# Patient Record
Sex: Male | Born: 1935 | Race: White | Hispanic: No | Marital: Married | Smoking: Former smoker
Health system: Southern US, Community
[De-identification: ages and names within clinical notes are randomized; demographics above are authoritative.]

## PROBLEM LIST (undated history)

## (undated) DIAGNOSIS — N289 Disorder of kidney and ureter, unspecified: Secondary | ICD-10-CM

## (undated) DIAGNOSIS — N451 Epididymitis: Secondary | ICD-10-CM

## (undated) HISTORY — PX: HEMORROIDECTOMY: SUR656

## (undated) HISTORY — PX: VASECTOMY: SHX75

## (undated) HISTORY — PX: CHOLECYSTECTOMY: SHX55

## (undated) HISTORY — PX: ELBOW SURGERY: SHX618

---

## 2006-06-26 ENCOUNTER — Encounter: Admission: RE | Admit: 2006-06-26 | Discharge: 2006-06-26 | Payer: Self-pay | Admitting: Nephrology

## 2008-06-23 ENCOUNTER — Encounter: Admission: RE | Admit: 2008-06-23 | Discharge: 2008-06-23 | Payer: Self-pay | Admitting: Orthopedic Surgery

## 2008-06-28 ENCOUNTER — Ambulatory Visit (HOSPITAL_BASED_OUTPATIENT_CLINIC_OR_DEPARTMENT_OTHER): Admission: RE | Admit: 2008-06-28 | Discharge: 2008-06-28 | Payer: Self-pay | Admitting: Orthopedic Surgery

## 2010-07-04 LAB — BASIC METABOLIC PANEL
CO2: 25 mEq/L (ref 19–32)
Calcium: 9.3 mg/dL (ref 8.4–10.5)
GFR calc Af Amer: 55 mL/min — ABNORMAL LOW (ref >60)
GFR calc non Af Amer: 45 mL/min — ABNORMAL LOW (ref >60)
Sodium: 140 mEq/L (ref 135–145)

## 2010-08-07 NOTE — Op Note (Signed)
NAME:  Ethan Andrade, Ethan Andrade                ACCOUNT NO.:  [id number]   MEDICAL RECORD NO.:  [id number]          PATIENT TYPE:  AMB   LOCATION:  DSC                          FACILITY:  [facility]   PHYSICIAN:  Katy Fitch. Sypher, M.D. 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   POSTOPERATIVE DIAGNOSIS:  Chronic entrapment neuropathy, left ulnar  nerve at cubital tunnel with positive electrodiagnostic studies,  intrinsic atrophy, positive Froment sign, and positive Wartenberg sign.   OPERATION:  In situ decompression of left ulnar nerve with partial  resection of medial head of triceps.   OPERATING SURGEON:  Katy Fitch. Sypher, MD   ASSISTANT:  Marveen Reeks Dasnoit, PAC   ANESTHESIA:  General by LMA.   SUPERVISING ANESTHESIOLOGIST:  Maren Beach, MD   INDICATIONS:  Ethan Andrade is a 75 year old gentleman referred through  the courtesy of Dr. Catha Gosselin for evaluation and management of left  hand weakness following an elbow injury 4 months prior.  Ethan Andrade  reported striking his elbow and rapidly developing numbness in the ring  and small fingers of the left hand accompanied by weakness of grip and  pinch.  Clinical examination revealed signs of a significant ulnar  neuropathy with nerve irritation symptoms at the cubital tunnel and  positive percussion test, positive hyperflexion test, weak pinch grip,  intrinsic atrophy, positive Froment sign, and positive Wartenberg sign.   Electrodiagnostic studies completed by Dr. Sharmon Revere, revealed  evidence of marked swelling across the left cubital tunnel with a  conduction loss of 34 meters per second.   Due to a failed respond to nonoperative  measures, Ethan Andrade was brought  to the operating room at this time for exploration of his ulnar nerve at  cubital tunnel anticipating decompression and possible transposition.  Preoperatively, he was advised that it would take up to 16 months to see  the full benefit of decompression if he had an axonal lesion.  He also  understands that if we find nerve instability, anterior subcutaneous  transposition would be indicated.   Questions were invited and answered in detail with Ethan Andrade and his  wife preoperatively.   PROCEDURE:  Ethan Andrade was brought to the operating room and placed in  supine position on operating table.   Following the induction of general anesthesia by LMA technique under Dr.  Michaelle Copas direct supervision, the left arm was prepped with Betadine soap  and solution, sterilely draped.  Ancef 1 g was administered as an IV  prophylactic antibiotic preoperatively.   The arm was exsanguinated with Esmarch bandage and an arterial  tourniquet on the proximal brachium inflated to 250 mmHg.  Procedure  commenced with a 2-cm incision directly over the path of the ulnar nerve  posterior to the medial epicondyle.  Subcutaneous tissues were carefully  divided along the subcutaneous fascia.  This  was quite thickened due to  post-traumatic fibrosis.  This was carefully divided taking care to  identify and retract the posterior branch of the medial antebrachial  cutaneous nerve.   The ulnar nerve was decompressed from 6 cm above the epicondyle to 6 cm  distal the epicondyle.   Osborne's band was definitely contracted causing compression of the  nerve and fascia, deep to the heads of flexor carpi ulnaris was also  contracted causing compression.  After thorough unroofing of the nerve,  hemostasis was achieved with bipolar cautery.  The elbow was ranged 0-  140 degrees and found to have no sign of ulnar nerve instability.   The medial head of the triceps did cover the nerve with  elbow flexion  beyond 95 degrees.  A strip of medial head measuring 4 mm in width and  approximately 2 cm in length was resected to unroof the nerve.  Thereafter, there was no irritation of the nerve from the triceps.   The wound was then repaired with subcutaneous suture of 4-0 Vicryl and  intradermal 3-0 Prolene with Steri-Strips.  Lidocaine 2% was infiltrated  for postop analgesia.  There were no apparent complications.      Katy Fitch Sypher, M.D.  Electronically Signed     RVS/MEDQ  D:  06/28/2008  T:  06/28/2008  Job:  [id number]   cc:   Caryn Bee L. Little, M.D.

## 2011-11-14 ENCOUNTER — Inpatient Hospital Stay (HOSPITAL_COMMUNITY)
Admission: EM | Admit: 2011-11-14 | Discharge: 2011-11-17 | DRG: 690 | Disposition: A | Discharge status: Home / Self Care | Payer: Medicare Other | Attending: Internal Medicine | Admitting: Internal Medicine

## 2011-11-14 ENCOUNTER — Inpatient Hospital Stay (HOSPITAL_COMMUNITY): Payer: Medicare Other

## 2011-11-14 ENCOUNTER — Encounter (HOSPITAL_COMMUNITY): Payer: Self-pay | Admitting: Emergency Medicine

## 2011-11-14 DIAGNOSIS — Z79899 Other long term (current) drug therapy: Secondary | ICD-10-CM

## 2011-11-14 DIAGNOSIS — D72829 Elevated white blood cell count, unspecified: Secondary | ICD-10-CM | POA: Diagnosis present

## 2011-11-14 DIAGNOSIS — Z7982 Long term (current) use of aspirin: Secondary | ICD-10-CM

## 2011-11-14 DIAGNOSIS — N179 Acute kidney failure, unspecified: Secondary | ICD-10-CM | POA: Diagnosis present

## 2011-11-14 DIAGNOSIS — R339 Retention of urine, unspecified: Secondary | ICD-10-CM | POA: Diagnosis present

## 2011-11-14 DIAGNOSIS — E860 Dehydration: Secondary | ICD-10-CM | POA: Diagnosis present

## 2011-11-14 DIAGNOSIS — A498 Other bacterial infections of unspecified site: Secondary | ICD-10-CM | POA: Diagnosis present

## 2011-11-14 DIAGNOSIS — N390 Urinary tract infection, site not specified: Principal | ICD-10-CM | POA: Diagnosis present

## 2011-11-14 DIAGNOSIS — N289 Disorder of kidney and ureter, unspecified: Secondary | ICD-10-CM | POA: Diagnosis present

## 2011-11-14 HISTORY — DX: Disorder of kidney and ureter, unspecified: N28.9

## 2011-11-14 HISTORY — DX: Epididymitis: N45.1

## 2011-11-14 LAB — BASIC METABOLIC PANEL
CO2: 21 mEq/L (ref 19–32)
Calcium: 8.7 mg/dL (ref 8.4–10.5)
Creatinine, Ser: 1.54 mg/dL — ABNORMAL HIGH (ref 0.50–1.35)
GFR calc Af Amer: 50 mL/min — ABNORMAL LOW (ref >90)

## 2011-11-14 LAB — URINALYSIS, MICROSCOPIC ONLY
Protein, ur: NEGATIVE mg/dL
Urobilinogen, UA: 1 mg/dL (ref 0.0–1.0)

## 2011-11-14 LAB — CBC WITH DIFFERENTIAL/PLATELET
Basophils Absolute: 0 10*3/uL (ref 0.0–0.1)
Lymphs Abs: 2.2 10*3/uL (ref 0.7–4.0)
MCH: 33.5 pg (ref 26.0–34.0)
MCV: 94.7 fL (ref 78.0–100.0)
Monocytes Absolute: 1.9 10*3/uL — ABNORMAL HIGH (ref 0.1–1.0)
Platelets: 233 10*3/uL (ref 150–400)
RDW: 12.7 % (ref 11.5–15.5)

## 2011-11-14 LAB — PROCALCITONIN: Procalcitonin: 1.12 ng/mL

## 2011-11-14 LAB — LACTIC ACID, PLASMA: Lactic Acid, Venous: 1.7 mmol/L (ref 0.5–2.2)

## 2011-11-14 MED ORDER — ONDANSETRON HCL 4 MG/2ML IJ SOLN: 4.0000 mg | Freq: Three times a day (TID) | INTRAMUSCULAR | Status: DC | PRN

## 2011-11-14 MED ORDER — DEXTROSE 5 % IV SOLN
1.0000 g | INTRAVENOUS | Status: DC
  Administered 2011-11-15 – 2011-11-17 (×3): 1 g via INTRAVENOUS
  Filled 2011-11-14 (×3): qty 10

## 2011-11-14 MED ORDER — ACETAMINOPHEN 325 MG PO TABS
650.0000 mg | ORAL_TABLET | Freq: Four times a day (QID) | ORAL | Status: DC | PRN
  Administered 2011-11-14: 650 mg via ORAL
  Filled 2011-11-14: qty 2

## 2011-11-14 MED ORDER — ACETAMINOPHEN 325 MG PO TABS: 650.0000 mg | ORAL_TABLET | ORAL | Status: DC | PRN

## 2011-11-14 MED ORDER — ZOLPIDEM TARTRATE 5 MG PO TABS: 5.0000 mg | ORAL_TABLET | Freq: Every evening | ORAL | Status: DC | PRN

## 2011-11-14 MED ORDER — SODIUM CHLORIDE 0.9 % IV SOLN
Freq: Once | INTRAVENOUS | Status: AC
  Administered 2011-11-14: 13:00:00 via INTRAVENOUS

## 2011-11-14 MED ORDER — DEXTROSE 5 % IV SOLN
1.0000 g | Freq: Once | INTRAVENOUS | Status: AC
  Administered 2011-11-14: 1 g via INTRAVENOUS
  Filled 2011-11-14: qty 10

## 2011-11-14 MED ORDER — ONDANSETRON HCL 4 MG PO TABS: 4.0000 mg | ORAL_TABLET | Freq: Four times a day (QID) | ORAL | Status: DC | PRN

## 2011-11-14 MED ORDER — ONDANSETRON HCL 4 MG/2ML IJ SOLN: 4.0000 mg | Freq: Four times a day (QID) | INTRAMUSCULAR | Status: DC | PRN

## 2011-11-14 MED ORDER — HYDROCODONE-ACETAMINOPHEN 5-325 MG PO TABS
1.0000 | ORAL_TABLET | ORAL | Status: DC | PRN
  Administered 2011-11-15: 1 via ORAL
  Filled 2011-11-14: qty 1

## 2011-11-14 MED ORDER — SODIUM CHLORIDE 0.9 % IV SOLN
INTRAVENOUS | Status: DC
  Administered 2011-11-14 – 2011-11-16 (×5): via INTRAVENOUS

## 2011-11-14 MED ORDER — ACETAMINOPHEN 325 MG PO TABS
650.0000 mg | ORAL_TABLET | Freq: Four times a day (QID) | ORAL | Status: DC | PRN
  Administered 2011-11-15: 650 mg via ORAL
  Filled 2011-11-14 (×2): qty 2

## 2011-11-14 MED ORDER — ACETAMINOPHEN 650 MG RE SUPP: 650.0000 mg | Freq: Four times a day (QID) | RECTAL | Status: DC | PRN

## 2011-11-14 MED ORDER — SODIUM CHLORIDE 0.9 % IV SOLN
INTRAVENOUS | Status: AC
  Administered 2011-11-14: 125 mL/h via INTRAVENOUS

## 2011-11-14 NOTE — ED Notes (Signed)
14 french foley catheter was placed, however, it's currently leaking.  Materials notified regarding larger foley size (16 french).

## 2011-11-14 NOTE — Progress Notes (Signed)
Ethan Andrade, is a 76 y.o. male,   MRN: [id number]  -  DOB - 03-29-1935  Outpatient Primary MD for the patient is Mickie Hillier, MD  in for    Chief Complaint  Patient presents with  . Urinary Tract Infection  . Urinary Retention     Blood pressure 123/61, pulse 91, temperature 99.8 F (37.7 C), temperature source Oral, resp. rate 18, SpO2 99.00%.  Principal Problem:  *Urinary retention Active Problems:  UTI (lower urinary tract infection)  Leukocytosis  Renal insufficiency   Very pleasant 76 yo hx renal insuff and epididymitis presents with cc urinary retention, dysuria, fever, chills. Pt reports symptoms began several days ago and have gotten progressively worse especially the dysuria and urinary retention. He also reports some abdominal pain. States he decreased his po intake because it was too painful to void. Then this am his bladder was full and he was unable to void. He called his pcp who recommended coming to ED.   Work up yields WC 31.2, temp 99.8. Urinalysis yields large leukocytes, WBC too numerous to count. Urine culture pending.   Other vital signs stable. Lactic acid and procalcitonin within the limits of normal. Blood cultures pending. Given IV fluids and rocephin and tylenol in ed.   On exam, pt is alert, warm to touch but in no acute distress. Non-toxic appearing. Not meeting criteria for SIRs/SEPSIS at this time.   Will admit to med surg.

## 2011-11-14 NOTE — H&P (Signed)
[facility] History and Physical  Ethan Andrade BJY:[id number] DOB: 09/24/1935 DOA: 11/14/2011  Referring physician: Angelique Holm PA PCP: Mickie Hillier, MD   Chief Complaint: urinary retention, dysuria  HPI:   Very pleasant 76 yo hx renal insuff and epididymitis presents with cc urinary retention, dysuria, fever, chills. Pt reports symptoms began several days ago and have gotten progressively worse especially the dysuria and urinary retention. He also reports some abdominal pain. States he decreased his po intake because it was too painful to void. Then this am his bladder was full and he was unable to void. He called his pcp who recommended coming to ED. Symptoms came on gradually have persisted and worsened. Nothing makes better and nothing makes worse. He denies CP, palpitation, shortness of breath,  headache, dizziness, numbness of extremities,  nausea/vomiting, diarrhea, constipation, melena. Work up in ED yields WC 31.2, temp 99.8. Urinalysis yields large leukocytes, WBC too numerous to count. Urine culture pending. Other vital signs stable. Lactic acid and procalcitonin within the limits of normal. Blood cultures pending. Given IV fluids and rocephin and tylenol in ed. [facility] has been asked to admit for further eval/treatment   Review of Systems:  10 point system reviewed and negative except as indicated in HPI  Past Medical History  Diagnosis Date  . Epididymitis   . Renal disorder     creatinine levels are high   Past Surgical History  Procedure Date  . Cholecystectomy    Social History:  reports that he has never smoked. He has never used smokeless tobacco. He reports that he does not drink alcohol or use illicit drugs. Lives at home with wife. Is independent with ADL  No Known Allergies  Family History  Problem Relation Age of Onset  . Other Brother     prostate cancer  . Other Brother     prostate cancer     Mother died at 13 of cirrhosis due to tylenol  use. Father deceased at 37 had prostate cancer. 6 siblings no CAD, DM.  Prior to Admission medications   Medication Sig Start Date End Date Taking? Authorizing Provider  aspirin 81 MG chewable tablet Chew 81 mg by mouth daily.   Yes Historical Provider, MD  Cholecalciferol (VITAMIN D-3) 5000 UNITS TABS Take 5,000 Units by mouth daily.   Yes Historical Provider, MD  fish oil-omega-3 fatty acids 1000 MG capsule Take 2 g by mouth 2 (two) times daily.   Yes Historical Provider, MD  Multiple Vitamins-Minerals (CENTRUM SILVER PO) Take 1 tablet by mouth daily.   Yes Historical Provider, MD   Physical Exam: Filed Vitals:   11/14/11 1022 11/14/11 1149  BP: 126/63 123/61  Pulse: 96 91  Temp: 98.9 F (37.2 C) 99.8 F (37.7 C)  TempSrc: Oral   Resp: 16 18  SpO2: 98% 99%     General:  Awake alert NAD  Eyes: PERRL EOMI no scleral icterus   ENT: mouth dry/pink. No drainage from nose. Ears clear  Neck: supple. Full rom no JVD  Cardiovascular: RRR, no MGR No LEE  Respiratory: normal effort. BSCTAB no wheeze/rhonchi  Abdomen: obese, soft + BS mild tenderness to palpation in bladder area.   Skin: warm/dry no rash/lesions  Musculoskeletal: MOE no joint swelling/erythema  Psychiatric: appropriate affect, cooperative  Neurologic: cranial nerve II-XII intact. Speech clear facial symmetry  Labs on Admission:  Basic Metabolic Panel:  Lab 11/14/11 0865  NA 135  K 4.2  CL 103  CO2 21  GLUCOSE 95  BUN 20  CREATININE 1.54*  CALCIUM 8.7  MG --  PHOS --   Liver Function Tests: No results found for this basename: AST:5,ALT:5,ALKPHOS:5,BILITOT:5,PROT:5,ALBUMIN:5 in the last 168 hours No results found for this basename: LIPASE:5,AMYLASE:5 in the last 168 hours No results found for this basename: AMMONIA:5 in the last 168 hours CBC:  Lab 11/14/11 1050  WBC 31.2*  NEUTROABS 27.1*  HGB 15.1  HCT 42.7  MCV 94.7  PLT 233   Cardiac Enzymes: No results found for this basename:  CKTOTAL:5,CKMB:5,CKMBINDEX:5,TROPONINI:5 in the last 168 hours  BNP (last 3 results) No results found for this basename: PROBNP:3 in the last 8760 hours CBG: No results found for this basename: GLUCAP:5 in the last 168 hours  Radiological Exams on Admission: No results found.  EKG: Independently reviewed.   Assessment/Plan Principal Problem:  *Urinary retention : likely related to UTI. Will continue foley catheter for now. Strict Intake and output. Will get renal US eval for obstruction. Plan to dc foley in 24-36 hours for voiding trial Active Problems:  UTI (lower urinary tract infection) cultures pending. Continue Rocephin until culture result. Provide pain med as needed.    Leukocytosis: likely related to UTI. Will continue rocephin. Blood culture and urine culture pending. Will get renal US. Provide tylenol for fever. Pt currently hemodynamically stable. Lactic acid and procalcitonin within normal limits. Will get vitals q2hrs x3. Will recheck cbc in am.    Renal insufficiency: creatinine currently 1.5. Chart review indicates that this is baseline. Will continue IV fluids, hold nephrotoxins and monitor.    Code Status: full Family Communication: wife and pt at bedside Disposition Plan: Home when medically stable.   Time spent: 40 minutes  Gwenyth Bender NP [facility]  If 7PM-7AM, please contact night-coverage [email or web address] Password [facility] 11/14/2011, 2:04 PM  Addendum: Patient was seen and examined at bedside. I have reviewed labs on admission. Plan: 1. UTI, leukocytosis - continue rocephin IV Q 24 hours 2. Urinary retention - will follow up on renal US, foley in place  Appreciate great help from NP in preparing this admission note.  For questions and concerns, call Manson Passey [facility]  Pager [phone]

## 2011-11-14 NOTE — ED Notes (Signed)
Had burning on urination started yesterday, and stopped urinating last night, has hx epididymitis years ago.

## 2011-11-14 NOTE — ED Notes (Signed)
Put in a Foley Catheter 14 inch leaking ,replaced it with a 16 inch

## 2011-11-14 NOTE — Progress Notes (Signed)
Confirmed with wife that pcp is kevin little

## 2011-11-14 NOTE — ED Provider Notes (Signed)
History     CSN: [id number]  Arrival date & time 11/14/11  1016   First MD Initiated Contact with Patient 11/14/11 1033      Chief Complaint  Patient presents with  . Urinary Tract Infection  . Urinary Retention    (Consider location/radiation/quality/duration/timing/severity/associated sxs/prior treatment) Patient is a 76 y.o. male presenting with urinary tract infection. The history is provided by the patient.  Urinary Tract Infection This is a new problem. The current episode started in the past 7 days. The problem occurs constantly. The problem has been gradually worsening. Associated symptoms include abdominal pain, chills, a fever and myalgias. Pertinent negatives include no change in bowel habit, headaches, nausea, neck pain, vomiting or weakness. Exacerbated by: urination. He has tried nothing for the symptoms.  Pt reports pain with urination for the last several days. States gradually, pain has worsened, and he began going to the bathroom frequently, going very little at a time. States this morning, unable to go at all.  Feels like bladder is full, but pt states he is unable to urinate.   Past Medical History  Diagnosis Date  . Epididymitis   . Renal disorder     creatinine levels are high    Past Surgical History  Procedure Date  . Cholecystectomy     History reviewed. No pertinent family history.  History  Substance Use Topics  . Smoking status: Never Smoker   . Smokeless tobacco: Not on file  . Alcohol Use: No      Review of Systems  Constitutional: Positive for fever and chills.  HENT: Negative for neck pain.   Respiratory: Negative.   Cardiovascular: Negative.   Gastrointestinal: Positive for abdominal pain. Negative for nausea, vomiting, diarrhea, constipation and change in bowel habit.  Genitourinary: Positive for dysuria, urgency, frequency, decreased urine volume and difficulty urinating. Negative for hematuria, flank pain, discharge, penile  swelling, scrotal swelling, penile pain and testicular pain.  Musculoskeletal: Positive for myalgias. Negative for back pain.  Skin: Negative.   Neurological: Negative for dizziness, weakness and headaches.  Psychiatric/Behavioral: Negative.     Allergies  Review of patient's allergies indicates no known allergies.  Home Medications  No current outpatient prescriptions on file.  BP 126/63  Pulse 96  Temp 98.9 F (37.2 C) (Oral)  Resp 16  SpO2 98%  Physical Exam  Nursing note and vitals reviewed. Constitutional: He is oriented to person, place, and time. He appears well-developed and well-nourished. No distress.  HENT:  Head: Normocephalic.  Eyes: Conjunctivae are normal.  Neck: Normal range of motion. Neck supple.  Cardiovascular: Normal rate, regular rhythm and normal heart sounds.   Pulmonary/Chest: Effort normal and breath sounds normal. No respiratory distress. He has no wheezes. He has no rales.  Abdominal: Soft. Bowel sounds are normal. There is no rebound.       Mild discomfort with palpation over bladder. No obvious distention  Musculoskeletal: He exhibits no edema.  Neurological: He is alert and oriented to person, place, and time.  Skin: Skin is warm and dry.  Psychiatric: He has a normal mood and affect.    ED Course  Procedures (including critical care time)  Pt with urinary symptoms and retention. VS here normal. Pt afebrile. Will get labs, UA, cultures.   Results for orders placed during the hospital encounter of 11/14/11  URINALYSIS, WITH MICROSCOPIC      Component Value Range   Color, Urine AMBER (*) YELLOW   APPearance CLOUDY (*) CLEAR   Specific  Gravity, Urine 1.024  1.005 - 1.030   pH 6.0  5.0 - 8.0   Glucose, UA NEGATIVE  NEGATIVE mg/dL   Hgb urine dipstick MODERATE (*) NEGATIVE   Bilirubin Urine NEGATIVE  NEGATIVE   Ketones, ur TRACE (*) NEGATIVE mg/dL   Protein, ur NEGATIVE  NEGATIVE mg/dL   Urobilinogen, UA 1.0  0.0 - 1.0 mg/dL   Nitrite  NEGATIVE  NEGATIVE   Leukocytes, UA LARGE (*) NEGATIVE   WBC, UA TOO NUMEROUS TO COUNT  <3 WBC/hpf   RBC / HPF 7-10  <3 RBC/hpf   Squamous Epithelial / LPF RARE  RARE  CBC WITH DIFFERENTIAL      Component Value Range   WBC 31.2 (*) 4.0 - 10.5 K/uL   RBC 4.51  4.22 - 5.81 MIL/uL   Hemoglobin 15.1  13.0 - 17.0 g/dL   HCT 16.1  09.6 - 04.5 %   MCV 94.7  78.0 - 100.0 fL   MCH 33.5  26.0 - 34.0 pg   MCHC 35.4  30.0 - 36.0 g/dL   RDW 40.9  81.1 - 91.4 %   Platelets 233  150 - 400 K/uL   Neutrophils Relative 87 (*) 43 - 77 %   Lymphocytes Relative 7 (*) 12 - 46 %   Monocytes Relative 6  3 - 12 %   Eosinophils Relative 0  0 - 5 %   Basophils Relative 0  0 - 1 %   Neutro Abs 27.1 (*) 1.7 - 7.7 K/uL   Lymphs Abs 2.2  0.7 - 4.0 K/uL   Monocytes Absolute 1.9 (*) 0.1 - 1.0 K/uL   Eosinophils Absolute 0.0  0.0 - 0.7 K/uL   Basophils Absolute 0.0  0.0 - 0.1 K/uL   WBC Morphology WHITE COUNT CONFIRMED ON SMEAR    BASIC METABOLIC PANEL      Component Value Range   Sodium 135  135 - 145 mEq/L   Potassium 4.2  3.5 - 5.1 mEq/L   Chloride 103  96 - 112 mEq/L   CO2 21  19 - 32 mEq/L   Glucose, Bld 95  70 - 99 mg/dL   BUN 20  6 - 23 mg/dL   Creatinine, Ser 7.82 (*) 0.50 - 1.35 mg/dL   Calcium 8.7  8.4 - 95.6 mg/dL   GFR calc non Af Amer 43 (*) >90 mL/min   GFR calc Af Amer 50 (*) >90 mL/min   No results found.  WBC 31, will get lactic acid, cultures. Pt still does not meed sepsis criteria, vs normal. No AMS. Rocephin ordered for infection. Urine cultures sent.  Filed Vitals:   11/14/11 1149  BP: 123/61  Pulse: 91  Temp: 99.8 F (37.7 C)  Resp: 18     1. Urinary tract infection   2. Urinary retention   3. Leukocytosis   4. Renal insufficiency       MDM          Lottie Mussel, [place] 11/16/11 [phone]

## 2011-11-15 DIAGNOSIS — N179 Acute kidney failure, unspecified: Secondary | ICD-10-CM | POA: Diagnosis present

## 2011-11-15 LAB — CBC WITH DIFFERENTIAL/PLATELET
Basophils Absolute: 0 10*3/uL (ref 0.0–0.1)
Eosinophils Relative: 0 % (ref 0–5)
HCT: 38.4 % — ABNORMAL LOW (ref 39.0–52.0)
Lymphocytes Relative: 8 % — ABNORMAL LOW (ref 12–46)
Lymphs Abs: 2.4 10*3/uL (ref 0.7–4.0)
MCV: 95 fL (ref 78.0–100.0)
Monocytes Relative: 5 % (ref 3–12)
Neutro Abs: 25.8 10*3/uL — ABNORMAL HIGH (ref 1.7–7.7)
RBC: 4.04 MIL/uL — ABNORMAL LOW (ref 4.22–5.81)
WBC Morphology: INCREASED
WBC: 29.7 10*3/uL — ABNORMAL HIGH (ref 4.0–10.5)

## 2011-11-15 LAB — COMPREHENSIVE METABOLIC PANEL
Albumin: 2.9 g/dL — ABNORMAL LOW (ref 3.5–5.2)
BUN: 18 mg/dL (ref 6–23)
Calcium: 8 mg/dL — ABNORMAL LOW (ref 8.4–10.5)
Creatinine, Ser: 1.66 mg/dL — ABNORMAL HIGH (ref 0.50–1.35)
GFR calc Af Amer: 45 mL/min — ABNORMAL LOW (ref >90)
Glucose, Bld: 82 mg/dL (ref 70–99)
Total Protein: 5.7 g/dL — ABNORMAL LOW (ref 6.0–8.3)

## 2011-11-15 LAB — GLUCOSE, CAPILLARY: Glucose-Capillary: 74 mg/dL (ref 70–99)

## 2011-11-15 MED ORDER — SODIUM CHLORIDE 0.9 % IV BOLUS (SEPSIS)
250.0000 mL | Freq: Once | INTRAVENOUS | Status: AC
  Administered 2011-11-15: 250 mL via INTRAVENOUS

## 2011-11-15 MED ORDER — POLYETHYLENE GLYCOL 3350 17 G PO PACK
17.0000 g | PACK | Freq: Every day | ORAL | Status: DC
  Administered 2011-11-15: 17 g via ORAL
  Filled 2011-11-15 (×3): qty 1

## 2011-11-15 NOTE — Progress Notes (Signed)
[facility] PROGRESS NOTE  Ethan Andrade ZOX:[id number] DOB: 1935/08/03 DOA: 11/14/2011 PCP: Mickie Hillier, MD  Brief narrative: 76 year old pleasant gentleman who presented 11/14/2011 with complaints of urinary retention, fever and dysuria.  Assessment/Plan:  Principal Problem:  *Urinary retention - Perhaps secondary to urinary tract infection and dehydration - Renal ultrasound with normal finding - Foley in place, less than 24 hours, we will discontinue it tomorrow for a voiding trial  Active Problems:  UTI (lower urinary tract infection) - Continue Rocephin - Blood cultures negative to date   Leukocytosis - Secondary to UTI - Management as above   Acute kidney injury - Secondary to UTI - Creatinine 1.66 today - Followup BMP in the morning  Code Status: Full code Family Communication: Updated family at bedside Disposition Plan: Home when stable  Manson Passey, MD  [facility] Pager [phone]  If 7PM-7AM, please contact night-coverage [email or web address] Password [email or web address] 11/15/2011, 3:05 PM   LOS: 1 day   Consultants:  None  Procedures:  None  Antibiotics:  Rocephin 11/14/2011 -->  HPI/Subjective: No acute events overnight.  Objective: Filed Vitals:   11/14/11 1551 11/14/11 2133 11/15/11 0610 11/15/11 0640  BP: 100/62 114/73 90/58 90/52   Pulse: 92 101 83   Temp: 99 F (37.2 C) 99.1 F (37.3 C) 99.1 F (37.3 C)   TempSrc: Oral Oral Oral   Resp: 18 22 18    Height: 5\' 10"  (1.778 m)     Weight: 99.791 kg (220 lb)  99.791 kg (220 lb)   SpO2: 96% 95% 95%     Intake/Output Summary (Last 24 hours) at 11/15/11 1505 Last data filed at 11/15/11 1344  Gross per 24 hour  Intake   2030 ml  Output   1300 ml  Net    730 ml    Exam:   General:  Pt is alert, follows commands appropriately, not in acute distress  Cardiovascular: Regular rate and rhythm, S1/S2, no murmurs, no rubs, no gallops  Respiratory: Clear to auscultation  bilaterally, no wheezing, no crackles, no rhonchi  Abdomen: Soft, non tender, non distended, bowel sounds present, no guarding  Extremities: No edema, pulses DP and PT palpable bilaterally  Neuro: Grossly nonfocal  Data Reviewed: Basic Metabolic Panel:  Lab 11/15/11 8295 11/14/11 1050  NA 132* 135  K 4.2 4.2  CL 102 103  CO2 19 21  GLUCOSE 82 95  BUN 18 20  CREATININE 1.66* 1.54*  CALCIUM 8.0* 8.7   Liver Function Tests:  Lab 11/15/11 0333  AST 21  ALT 16  ALKPHOS 68  BILITOT 0.6  PROT 5.7*  ALBUMIN 2.9*   CBC:  Lab 11/15/11 0333 11/14/11 1050  WBC 29.7* 31.2*  HGB 13.2 15.1  HCT 38.4* 42.7  MCV 95.0 94.7  PLT 196 233   CBG:  Lab 11/15/11 0809  GLUCAP 74    CULTURE, BLOOD (ROUTINE X 2)     Status: Normal (Preliminary result)   Collection Time   11/14/11 12:11 PM      Component Value Range Status Comment   Culture     Final    Value:        BLOOD CULTURE RECEIVED NO GROWTH TO DATE    Report Status PENDING   Incomplete        Status: Normal (Preliminary result)   Collection Time   11/14/11 12:40 PM      Component Value Range Status Comment   Culture     Final  Value:        BLOOD CULTURE RECEIVED NO GROWTH TO DATE    Report Status PENDING   Incomplete      Studies: US Renal 11/14/2011   IMPRESSION: Negative for renal obstruction.      Scheduled Meds:   . cefTRIAXone (ROCEPHIN)  IV  1 g Intravenous Q24H

## 2011-11-15 NOTE — Progress Notes (Signed)
UR done. 

## 2011-11-15 NOTE — Progress Notes (Signed)
PT Cancellation Note  Treatment cancelled today due to pt sound asleep.  Family in room states that pt is 'doing fine' with his ambulation and does not need PT.  Ebony Hail [facility] 11/15/2011, 11:07 AM

## 2011-11-16 DIAGNOSIS — N179 Acute kidney failure, unspecified: Secondary | ICD-10-CM

## 2011-11-16 LAB — CBC
MCH: 32.9 pg (ref 26.0–34.0)
MCHC: 34.4 g/dL (ref 30.0–36.0)
Platelets: 175 10*3/uL (ref 150–400)
RBC: 3.86 MIL/uL — ABNORMAL LOW (ref 4.22–5.81)
RDW: 12.9 % (ref 11.5–15.5)

## 2011-11-16 LAB — BASIC METABOLIC PANEL
Calcium: 8.3 mg/dL — ABNORMAL LOW (ref 8.4–10.5)
GFR calc Af Amer: 52 mL/min — ABNORMAL LOW (ref >90)
GFR calc non Af Amer: 44 mL/min — ABNORMAL LOW (ref >90)
Sodium: 134 mEq/L — ABNORMAL LOW (ref 135–145)

## 2011-11-16 LAB — GLUCOSE, CAPILLARY: Glucose-Capillary: 123 mg/dL — ABNORMAL HIGH (ref 70–99)

## 2011-11-16 MED ORDER — ACYCLOVIR 5 % EX OINT
1.0000 "application " | TOPICAL_OINTMENT | Freq: Every day | CUTANEOUS | Status: DC
  Administered 2011-11-16 – 2011-11-17 (×5): 1 via TOPICAL
  Filled 2011-11-16: qty 30

## 2011-11-16 NOTE — Progress Notes (Addendum)
[facility] PROGRESS NOTE  Ethan Andrade ZOX:[id number] DOB: 18-Jan-1936 DOA: 11/14/2011 PCP: Mickie Hillier, MD  Brief narrative: 76 year old pleasant gentleman who presented 11/14/2011 with complaints of urinary retention, fever and dysuria.   Assessment/Plan:   Principal Problem:  *Urinary retention  - Perhaps secondary to urinary tract infection and dehydration  - Renal ultrasound with normal finding  - will d/c foley for voiding trial  Active Problems:  UTI (lower urinary tract infection) secondary to E.Coli - Continue Rocephin  - urine culture grew E. Coli. Follow up sensitivities - Blood cultures negative to date   Leukocytosis  - Secondary to UTI  - Management as above   Acute kidney injury  - Secondary to UTI  - Creatinine trending down  - Followup BMP in the morning   Code Status: Full code  Family Communication: Updated family at bedside  Disposition Plan: Home when stable likely in next 24 hours  Consultants:  None Procedures:  None Antibiotics:  Rocephin 11/14/2011 -->  Manson Passey, MD  [facility] Pager [phone]  If 7PM-7AM, please contact night-coverage [email or web address] Password [email or web address] 11/16/2011, 9:25 AM   LOS: 2 days   HPI/Subjective: No acute events overnight.  Objective: Filed Vitals:   11/15/11 0640 11/15/11 1315 11/15/11 2214 11/16/11 0642  BP: 90/52 113/63 149/72 130/64  Pulse:  82 84 86  Temp:  98.9 F (37.2 C) 98.9 F (37.2 C) 100.6 F (38.1 C)  TempSrc:  Oral Oral Oral  Resp:  20 18 18   Height:      Weight:    100.381 kg (221 lb 4.8 oz)  SpO2:  96% 98% 96%    Intake/Output Summary (Last 24 hours) at 11/16/11 0925 Last data filed at 11/16/11 0644  Gross per 24 hour  Intake 2411.32 ml  Output   3600 ml  Net -1188.68 ml    Exam:   General:  Pt is alert, follows commands appropriately, not in acute distress  Cardiovascular: Regular rate and rhythm, S1/S2, no murmurs, no rubs, no  gallops  Respiratory: Clear to auscultation bilaterally, no wheezing, no crackles, no rhonchi  Abdomen: Soft, non tender, non distended, bowel sounds present, no guarding  Extremities: No edema, pulses DP and PT palpable bilaterally  Neuro: Grossly nonfocal  Data Reviewed: Basic Metabolic Panel:  Lab 11/16/11 8295 11/15/11 0333 11/14/11 1050  NA 134* 132* 135  K 3.9 4.2 4.2  CL 104 102 103  CO2 18* 19 21  GLUCOSE 101* 82 95  BUN 18 18 20   CREATININE 1.49* 1.66* 1.54*  CALCIUM 8.3* 8.0* 8.7  MG -- -- --  PHOS -- -- --   Liver Function Tests:  Lab 11/15/11 0333  AST 21  ALT 16  ALKPHOS 68  BILITOT 0.6  PROT 5.7*  ALBUMIN 2.9*   No results found for this basename: LIPASE:5,AMYLASE:5 in the last 168 hours No results found for this basename: AMMONIA:5 in the last 168 hours CBC:  Lab 11/16/11 0420 11/15/11 0333 11/14/11 1050  WBC 18.4* 29.7* 31.2*  NEUTROABS -- 25.8* 27.1*  HGB 12.7* 13.2 15.1  HCT 36.9* 38.4* 42.7  MCV 95.6 95.0 94.7  PLT 175 196 233   Cardiac Enzymes: No results found for this basename: CKTOTAL:5,CKMB:5,CKMBINDEX:5,TROPONINI:5 in the last 168 hours BNP: No components found with this basename: POCBNP:5 CBG:  Lab 11/16/11 0746 11/15/11 0809  GLUCAP 123* 74    Recent Results (from the past 240 hour(s))  URINE CULTURE     Status: Normal (Preliminary  result)   Collection Time   11/14/11 11:14 AM      Component Value Range Status Comment   Specimen Description URINE, CATHETERIZED   Final    Special Requests NONE   Final    Culture  Setup Time 11/15/2011 02:31   Final    Colony Count >=100,000 COLONIES/ML   Final    Culture ESCHERICHIA COLI   Final    Report Status PENDING   Incomplete   CULTURE, BLOOD (ROUTINE X 2)     Status: Normal (Preliminary result)   Collection Time   11/14/11 12:11 PM      Component Value Range Status Comment   Specimen Description BLOOD LEFT ARM   Final    Special Requests BOTTLES DRAWN AEROBIC AND ANAEROBIC 3CC  EACH   Final    Culture  Setup Time 11/14/2011 20:40   Final    Culture     Final    Value:        BLOOD CULTURE RECEIVED NO GROWTH TO DATE CULTURE WILL BE HELD FOR 5 DAYS BEFORE ISSUING A FINAL NEGATIVE REPORT   Report Status PENDING   Incomplete   CULTURE, BLOOD (ROUTINE X 2)     Status: Normal (Preliminary result)   Collection Time   11/14/11 12:40 PM      Component Value Range Status Comment   Specimen Description BLOOD LEFT HAND   Final    Special Requests BOTTLES DRAWN AEROBIC AND ANAEROBIC 4CC EACH   Final    Culture  Setup Time 11/14/2011 20:39   Final    Culture     Final    Value:        BLOOD CULTURE RECEIVED NO GROWTH TO DATE CULTURE WILL BE HELD FOR 5 DAYS BEFORE ISSUING A FINAL NEGATIVE REPORT   Report Status PENDING   Incomplete      Studies: US Renal  11/14/2011  *RADIOLOGY REPORT*  Clinical Data: UTI.  Fever  RENAL/URINARY TRACT ULTRASOUND COMPLETE  Comparison:  Ultrasound 06/26/2006  Findings:  Right Kidney:  10.9 cm.  Negative  Left Kidney:  10.7 cm.  Negative  Bladder:  Empty urinary bladder with a catheter.  IMPRESSION: Negative for renal obstruction.   Original Report Authenticated By: Camelia Phenes, M.D.     Scheduled Meds:   . cefTRIAXone (ROCEPHIN)  IV  1 g Intravenous Q24H  . polyethylene glycol  17 g Oral Daily   Continuous Infusions:   . sodium chloride 75 mL/hr at 11/16/11 [phone]

## 2011-11-17 LAB — CBC
HCT: 36.8 % — ABNORMAL LOW (ref 39.0–52.0)
Hemoglobin: 12.9 g/dL — ABNORMAL LOW (ref 13.0–17.0)
MCH: 32.9 pg (ref 26.0–34.0)
MCHC: 35.1 g/dL (ref 30.0–36.0)
RBC: 3.92 MIL/uL — ABNORMAL LOW (ref 4.22–5.81)

## 2011-11-17 MED ORDER — ACYCLOVIR 5 % EX OINT: 1.0000 "application " | TOPICAL_OINTMENT | Freq: Every day | CUTANEOUS | Status: AC

## 2011-11-17 MED ORDER — CIPROFLOXACIN HCL 500 MG PO TABS: 500.0000 mg | ORAL_TABLET | Freq: Two times a day (BID) | ORAL | Status: AC

## 2011-11-17 NOTE — Discharge Summary (Signed)
Physician Discharge Summary  Ethan Andrade EAV:[id number] DOB: 12-26-1935 DOA: 11/14/2011  PCP: Mickie Hillier, MD  Admit date: 11/14/2011 Discharge date: 11/17/2011  Outpatient follow up Patient reported having a physical exam appointment with PCP coming beginning of the September and will follow up on checking renal function to ensure the trend down  Discharge Diagnoses:  Principal Problem:  *Urinary retention Active Problems:  UTI (lower urinary tract infection)  Leukocytosis  Acute kidney injury  Discharge Condition: medically stable for discharge home today  Diet recommendation: as tolerated   History of present illness:  76 year old pleasant gentleman who presented 11/14/2011 with complaints of urinary retention, fever and dysuria.   Assessment/Plan:   Principal Problem:  *Urinary retention  - Perhaps secondary to urinary tract infection and dehydration  - Renal ultrasound with normal finding  - patient able to void freely  Active Problems:  UTI (lower urinary tract infection) secondary to E.Coli  - will d/c home with cipro for 7 days - urine culture grew E. Coli.  - Blood cultures negative to date   Leukocytosis  - Secondary to UTI  - Management as above  - WBC count today 11.4  Acute kidney injury  - Secondary to UTI  - Creatinine trending down   Code Status: Full code  Family Communication: Updated family at bedside  Disposition Plan: Home today  Consultants:  None Procedures:  None Antibiotics:  Rocephin 11/14/2011 -->11/17/2011; start cipro on discharge for 7 days  Discharge Exam: Filed Vitals:   11/17/11 0510  BP: 136/75  Pulse: 77  Temp: 98.2 F (36.8 C)  Resp: 16   Filed Vitals:   11/16/11 0642 11/16/11 1332 11/16/11 2101 11/17/11 0510  BP: 130/64 140/69 136/61 136/75  Pulse: 86 78 79 77  Temp: 100.6 F (38.1 C) 98.2 F (36.8 C) 98 F (36.7 C) 98.2 F (36.8 C)  TempSrc: Oral Oral Oral Oral  Resp: 18 18 19 16   Height:        Weight: 100.381 kg (221 lb 4.8 oz)     SpO2: 96% 100% 98% 96%    General: Pt is alert, follows commands appropriately, not in acute distress Cardiovascular: Regular rate and rhythm, S1/S2 +, no murmurs, no rubs, no gallops Respiratory: Clear to auscultation bilaterally, no wheezing, no crackles, no rhonchi Abdominal: Soft, non tender, non distended, bowel sounds +, no guarding Extremities: no edema, no cyanosis, pulses palpable bilaterally DP and PT Neuro: Grossly nonfocal  Discharge Instructions  Discharge Orders    Future Orders Please Complete By Expires   Diet - low sodium heart healthy      Increase activity slowly      Call MD for:  persistant nausea and vomiting      Call MD for:  severe uncontrolled pain      Call MD for:  difficulty breathing, headache or visual disturbances      Call MD for:  persistant dizziness or light-headedness        Medication List  As of 11/17/2011  9:09 AM   TAKE these medications         acyclovir ointment 5 %   Commonly known as: ZOVIRAX   Apply 1 application topically 5 (five) times daily.      aspirin 81 MG chewable tablet   Chew 81 mg by mouth daily.      CENTRUM SILVER PO   Take 1 tablet by mouth daily.      ciprofloxacin 500 MG tablet  Commonly known as: CIPRO   Take 1 tablet (500 mg total) by mouth 2 (two) times daily.      fish oil-omega-3 fatty acids 1000 MG capsule   Take 2 g by mouth 2 (two) times daily.      Vitamin D-3 5000 UNITS Tabs   Take 5,000 Units by mouth daily.           Follow-up Information    Follow up with Mickie Hillier, MD. (or sooner if symptoms worsen)    Contact information:   [address] [place] [address] [phone]           The results of significant diagnostics from this hospitalization (including imaging, microbiology, ancillary and laboratory) are listed below for reference.    Significant Diagnostic Studies: US Renal 11/14/2011  *RADIOLOGY REPORT*   Clinical Data: UTI.  Fever  RENAL/URINARY TRACT ULTRASOUND COMPLETE  Comparison:  Ultrasound 06/26/2006  Findings:  Right Kidney:  10.9 cm.  Negative  Left Kidney:  10.7 cm.  Negative  Bladder:  Empty urinary bladder with a catheter.  IMPRESSION: Negative for renal obstruction.   Original Report Authenticated By: Camelia Phenes, M.D.     Microbiology: URINE CULTURE     Status: Normal (Preliminary result)   Collection Time   11/14/11 11:14 AM      Component Value Range Status Comment   Specimen Description URINE, CATH  Final    Special Requests NONE   Final    Culture  Setup Time 11/15/2011 02:31   Final    Colony Count >=100,000 COLONIES/ML  Final    Culture ESCHERICHIA COLI   Final    Report Status PENDING   Incomplete   CULTURE, BLOOD (ROUTINE X 2)     Status: Normal (Preliminary result)   Collection Time   11/14/11 12:11 PM      Component Value Range Status Comment   Culture     Final    Value:        BLOOD CULTURE RECEIVED NO GROWTH TO DATE    Report Status PENDING   Incomplete   CULTURE, BLOOD (ROUTINE X 2)     Status: Normal (Preliminary result)   Collection Time   11/14/11 12:40 PM      Component Value Range Status Comment   Culture     Final    Value:        BLOOD CULTURE RECEIVED NO GROWTH TO DATE    Report Status PENDING   Incomplete      Labs: Basic Metabolic Panel:  Lab 11/16/11 9147 11/15/11 0333 11/14/11 1050  NA 134* 132* 135  K 3.9 4.2 4.2  CL 104 102 103  CO2 18* 19 21  GLUCOSE 101* 82 95  BUN 18 18 20   CREATININE 1.49* 1.66* 1.54*  CALCIUM 8.3* 8.0* 8.7   Liver Function Tests:  Lab 11/15/11 0333  AST 21  ALT 16  ALKPHOS 68  BILITOT 0.6  PROT 5.7*  ALBUMIN 2.9*   CBC:  Lab 11/16/11 0420 11/15/11 0333 11/14/11 1050  WBC 18.4* 29.7* 31.2*  HGB 12.7* 13.2 15.1  HCT 36.9* 38.4* 42.7  MCV 95.6 95.0 94.7  PLT 175 196 233   CBG:  Lab 11/17/11 0752 11/16/11 0746 11/15/11 0809  GLUCAP 150* 123* 74    Time coordinating discharge: Over 30  minutes  Signed:  Manson Passey, MD  [facility] 11/17/2011, 9:09 AM  Pager #: [phone]

## 2011-11-17 NOTE — Progress Notes (Signed)
Pt discharged to home with family, discharge instructions reviewed with pt and wife, new RX's given.

## 2011-11-19 NOTE — ED Provider Notes (Signed)
Medical screening examination/treatment/procedure(s) were conducted as a shared visit with non-physician practitioner(s) and myself.  I personally evaluated the patient during the encounter  Cyndra Numbers, MD 11/19/11 [phone]

## 2011-11-20 LAB — CULTURE, BLOOD (ROUTINE X 2): Culture: NO GROWTH

## 2011-11-20 LAB — URINE CULTURE: Colony Count: >=100000

## 2014-04-22 DIAGNOSIS — R799 Abnormal finding of blood chemistry, unspecified: Secondary | ICD-10-CM | POA: Diagnosis not present

## 2014-04-22 DIAGNOSIS — G4700 Insomnia, unspecified: Secondary | ICD-10-CM | POA: Diagnosis not present

## 2014-05-06 DIAGNOSIS — L989 Disorder of the skin and subcutaneous tissue, unspecified: Secondary | ICD-10-CM | POA: Diagnosis not present

## 2014-05-17 DIAGNOSIS — D0462 Carcinoma in situ of skin of left upper limb, including shoulder: Secondary | ICD-10-CM | POA: Diagnosis not present

## 2014-05-17 DIAGNOSIS — D225 Melanocytic nevi of trunk: Secondary | ICD-10-CM | POA: Diagnosis not present

## 2014-05-19 DIAGNOSIS — H3531 Nonexudative age-related macular degeneration: Secondary | ICD-10-CM | POA: Diagnosis not present

## 2014-07-19 DIAGNOSIS — D0462 Carcinoma in situ of skin of left upper limb, including shoulder: Secondary | ICD-10-CM | POA: Diagnosis not present

## 2014-07-19 DIAGNOSIS — X32XXXD Exposure to sunlight, subsequent encounter: Secondary | ICD-10-CM | POA: Diagnosis not present

## 2014-07-19 DIAGNOSIS — L570 Actinic keratosis: Secondary | ICD-10-CM | POA: Diagnosis not present

## 2014-09-19 DIAGNOSIS — I10 Essential (primary) hypertension: Secondary | ICD-10-CM | POA: Diagnosis not present

## 2014-09-19 DIAGNOSIS — G4721 Circadian rhythm sleep disorder, delayed sleep phase type: Secondary | ICD-10-CM | POA: Diagnosis not present

## 2014-09-22 DIAGNOSIS — G4721 Circadian rhythm sleep disorder, delayed sleep phase type: Secondary | ICD-10-CM | POA: Diagnosis not present

## 2014-11-08 DIAGNOSIS — G4721 Circadian rhythm sleep disorder, delayed sleep phase type: Secondary | ICD-10-CM | POA: Diagnosis not present

## 2014-11-08 DIAGNOSIS — F321 Major depressive disorder, single episode, moderate: Secondary | ICD-10-CM | POA: Diagnosis not present

## 2014-11-11 DIAGNOSIS — Z23 Encounter for immunization: Secondary | ICD-10-CM | POA: Diagnosis not present

## 2014-11-11 DIAGNOSIS — R7989 Other specified abnormal findings of blood chemistry: Secondary | ICD-10-CM | POA: Diagnosis not present

## 2014-11-11 DIAGNOSIS — F329 Major depressive disorder, single episode, unspecified: Secondary | ICD-10-CM | POA: Diagnosis not present

## 2014-11-11 DIAGNOSIS — R946 Abnormal results of thyroid function studies: Secondary | ICD-10-CM | POA: Diagnosis not present

## 2014-11-17 DIAGNOSIS — H3531 Nonexudative age-related macular degeneration: Secondary | ICD-10-CM | POA: Diagnosis not present

## 2014-12-22 DIAGNOSIS — G4721 Circadian rhythm sleep disorder, delayed sleep phase type: Secondary | ICD-10-CM | POA: Diagnosis not present

## 2015-01-11 DIAGNOSIS — F17200 Nicotine dependence, unspecified, uncomplicated: Secondary | ICD-10-CM | POA: Diagnosis not present

## 2015-01-11 DIAGNOSIS — E785 Hyperlipidemia, unspecified: Secondary | ICD-10-CM | POA: Diagnosis not present

## 2015-01-11 DIAGNOSIS — L570 Actinic keratosis: Secondary | ICD-10-CM | POA: Diagnosis not present

## 2015-01-11 DIAGNOSIS — G4700 Insomnia, unspecified: Secondary | ICD-10-CM | POA: Diagnosis not present

## 2015-01-11 DIAGNOSIS — Z0000 Encounter for general adult medical examination without abnormal findings: Secondary | ICD-10-CM | POA: Diagnosis not present

## 2015-01-11 DIAGNOSIS — N183 Chronic kidney disease, stage 3 (moderate): Secondary | ICD-10-CM | POA: Diagnosis not present

## 2015-01-11 DIAGNOSIS — J309 Allergic rhinitis, unspecified: Secondary | ICD-10-CM | POA: Diagnosis not present

## 2015-01-11 DIAGNOSIS — I10 Essential (primary) hypertension: Secondary | ICD-10-CM | POA: Diagnosis not present

## 2015-01-18 DIAGNOSIS — X32XXXD Exposure to sunlight, subsequent encounter: Secondary | ICD-10-CM | POA: Diagnosis not present

## 2015-01-18 DIAGNOSIS — L570 Actinic keratosis: Secondary | ICD-10-CM | POA: Diagnosis not present

## 2015-05-30 DIAGNOSIS — M25562 Pain in left knee: Secondary | ICD-10-CM | POA: Diagnosis not present

## 2015-06-01 DIAGNOSIS — S83242A Other tear of medial meniscus, current injury, left knee, initial encounter: Secondary | ICD-10-CM | POA: Diagnosis not present

## 2015-06-07 DIAGNOSIS — M25562 Pain in left knee: Secondary | ICD-10-CM | POA: Diagnosis not present

## 2015-06-08 DIAGNOSIS — M25562 Pain in left knee: Secondary | ICD-10-CM | POA: Diagnosis not present

## 2016-02-02 DIAGNOSIS — F338 Other recurrent depressive disorders: Secondary | ICD-10-CM | POA: Diagnosis not present

## 2016-02-02 DIAGNOSIS — Z0000 Encounter for general adult medical examination without abnormal findings: Secondary | ICD-10-CM | POA: Diagnosis not present

## 2016-02-02 DIAGNOSIS — I10 Essential (primary) hypertension: Secondary | ICD-10-CM | POA: Diagnosis not present

## 2016-02-02 DIAGNOSIS — Z23 Encounter for immunization: Secondary | ICD-10-CM | POA: Diagnosis not present

## 2016-02-02 DIAGNOSIS — N183 Chronic kidney disease, stage 3 (moderate): Secondary | ICD-10-CM | POA: Diagnosis not present

## 2016-02-02 DIAGNOSIS — G4700 Insomnia, unspecified: Secondary | ICD-10-CM | POA: Diagnosis not present

## 2016-02-02 DIAGNOSIS — F17200 Nicotine dependence, unspecified, uncomplicated: Secondary | ICD-10-CM | POA: Diagnosis not present

## 2016-02-02 DIAGNOSIS — L570 Actinic keratosis: Secondary | ICD-10-CM | POA: Diagnosis not present

## 2016-02-02 DIAGNOSIS — E782 Mixed hyperlipidemia: Secondary | ICD-10-CM | POA: Diagnosis not present

## 2016-02-02 DIAGNOSIS — Z125 Encounter for screening for malignant neoplasm of prostate: Secondary | ICD-10-CM | POA: Diagnosis not present

## 2016-02-13 DIAGNOSIS — L570 Actinic keratosis: Secondary | ICD-10-CM | POA: Diagnosis not present

## 2016-02-13 DIAGNOSIS — X32XXXD Exposure to sunlight, subsequent encounter: Secondary | ICD-10-CM | POA: Diagnosis not present

## 2016-02-28 DIAGNOSIS — H1851 Endothelial corneal dystrophy: Secondary | ICD-10-CM | POA: Diagnosis not present

## 2016-02-28 DIAGNOSIS — H43813 Vitreous degeneration, bilateral: Secondary | ICD-10-CM | POA: Diagnosis not present

## 2016-02-28 DIAGNOSIS — H2513 Age-related nuclear cataract, bilateral: Secondary | ICD-10-CM | POA: Diagnosis not present

## 2016-02-28 DIAGNOSIS — H31092 Other chorioretinal scars, left eye: Secondary | ICD-10-CM | POA: Diagnosis not present

## 2016-02-28 DIAGNOSIS — H35373 Puckering of macula, bilateral: Secondary | ICD-10-CM | POA: Diagnosis not present

## 2016-05-29 DIAGNOSIS — R251 Tremor, unspecified: Secondary | ICD-10-CM | POA: Diagnosis not present

## 2016-05-29 DIAGNOSIS — G4700 Insomnia, unspecified: Secondary | ICD-10-CM | POA: Diagnosis not present

## 2016-06-27 ENCOUNTER — Encounter: Payer: Self-pay | Admitting: Neurology

## 2016-06-27 ENCOUNTER — Ambulatory Visit (INDEPENDENT_AMBULATORY_CARE_PROVIDER_SITE_OTHER): Payer: Medicare HMO | Admitting: Neurology

## 2016-06-27 VITALS — BP 110/62 | HR 92 | Resp 18 | Ht 69.5 in | Wt 236.0 lb

## 2016-06-27 DIAGNOSIS — G4721 Circadian rhythm sleep disorder, delayed sleep phase type: Secondary | ICD-10-CM

## 2016-06-27 MED ORDER — ZOLPIDEM TARTRATE 5 MG PO TABS: 5.0000 mg | ORAL_TABLET | Freq: Every evening | ORAL | 0 refills | Status: DC | PRN

## 2016-06-27 NOTE — Progress Notes (Signed)
SLEEP MEDICINE CLINIC   Provider:  Larey Seat, [place] D  Primary Care Physician:  Ethan Pac, MD   Referring Provider: Hulan Fess, MD    Chief Complaint  Patient presents with  . Sleep Consult    Rm 10. Patient states that he has been seeing a sleep doctor and was diagnosed with CRSD. Patient is also c/o tremors in his hands, and whole body jerking during the night.     HPI:  Ethan Andrade is a 81 y.o. male , seen here as in a referral to our sleep clinic,  from Dr. Hulan Andrade , referral for Insomnia (and Tremor).  Chief complaint according to patient : Trembling, and cannot go to sleep. Feeling fatigued.   Ethan Andrade carries a diagnosis of circadian rhythm sleep disorder, The manifestation of a circadian rhythm disorder is usually but the patient cannot sleep at night and sleeps best in the morning hours or daytime. This can be a form of insomnia. 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 This is an acquired form of a circadian rhythm disorder. He has followed Dr. Maxwell Andrade for the last 2-3 years. Dr. Maxwell Andrade had initiated a regimen including Lunesta as a sleep aid and SSRI antidepressants. Dr. Maxwell Andrade had implemented a certain sleep routine advancing the bedtime gradually over a period of time. Before the patient saw Dr. Maxwell Andrade in his bedtime could be as late as 4 AM. About 3 years ago they begun changing his bedtime with the help of the above medications, but the patient states that Ethan Andrade so and stopped working for him. In addition, he became ill and couldn't leave the bed for about 3 days, which resets his inner clock. He also struggles with each time change. Dr Ethan Andrade does not want to prescribe sleep aids. His wife has noted jerking movements and trembling in his sleep. The amplitude is strong enough to wake her up.     My Colleague , Dr Ethan Andrade , is a MD, PhD Movement disorder specialist in the [facility]. I may need her input .    Sleep habits are as follows: Just recently the patient has been able to sleep only every other day but not at night. He goes at least 24 hours without sleep sometimes 36. He is known to sometimes not sleep t2-3 days in a row. This affects his social life of the family and marriage. Currently he goes to bed around 2 AM trying to go to sleep- but not always successfully. Sometimes he has by choice decided not to sleep in daytime and usually can then sleep some hours at night. He does not take daytime naps. If he goes to sleep at 2 AM he will stay asleep until his wife wakes him up at 12:00 or 13.00 hours. No snoring, No apnea.    Sleep medical history and family sleep history:  No family history of sleep disorders . Oldest sister takes a sleep aid.  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   Social history: married, former Veterinary surgeon, physically active shift job, retired at Andrade 48. Used to sleep a lot in hotels, [employer], worker's cabins.  He has a very remote history of smoking and quit at Andrade 38, is been a nonsmoker for 50+ years. He quit drinking the same year as he quit smoking. He does not drink caffeine native beverages of any kind.  Review of Systems: Out  of a complete 14 system review, the patient complains of only the following symptoms, and all other reviewed systems are negative. He has noted daytime tremors in both hands. He is hearing impaired.    Epworth score 1, Fatigue severity score 36  , depression score 3/ 15    Social History   Social History  . Marital status: Married    Spouse name: N/A  . Number of children: 2  . Years of education: HS   Occupational History  . Retired    Social History Main Topics  . Smoking status: Former Research scientist (life sciences)  . Smokeless tobacco: Current User    Types: Chew  . Alcohol use Yes     Comment: Rare  . Drug use: No  . Sexual activity: No   Other Topics Concern  . Not on file   Social  History Narrative   Caffeine use: rare     Family History  Problem Relation Andrade of Onset  . Other Brother     prostate cancer  . Other Brother     prostate cancer    Past Medical History:  Diagnosis Date  . Epididymitis   . Renal disorder    creatinine levels are high    Past Surgical History:  Procedure Laterality Date  . CHOLECYSTECTOMY    . ELBOW SURGERY    . HEMORROIDECTOMY    . VASECTOMY      Current Outpatient Prescriptions  Medication Sig Dispense Refill  . aspirin 81 MG chewable tablet Chew 81 mg by mouth daily.    Marland Kitchen escitalopram (LEXAPRO) 10 MG tablet     . fish oil-omega-3 fatty acids 1000 MG capsule Take 2 g by mouth 2 (two) times daily.    Marland Kitchen lisinopril (PRINIVIL,ZESTRIL) 10 MG tablet TK 1 T PO QD  1  . Multiple Vitamins-Minerals (CENTRUM SILVER PO) Take 1 tablet by mouth daily.    . saw palmetto 500 MG capsule Take 500 mg by mouth daily.     No current facility-administered medications for this visit.     Allergies as of 06/27/2016  . (No Known Allergies)    Vitals: BP 110/62   Pulse 92   Resp 18   Ht 5' 9.5" (1.765 m)   Wt 236 lb (107 kg)   BMI 34.35 kg/m  Last Weight:  Wt Readings from Last 1 Encounters:  06/27/16 236 lb (107 kg)   ZDG:UYQI mass index is 34.35 kg/m.     Last Height:   Ht Readings from Last 1 Encounters:  06/27/16 5' 9.5" (1.765 m)    Physical exam:  General: The patient is awake, alert and appears not in acute distress. The patient is well groomed. Head: Normocephalic, atraumatic. Neck is supple. Mallampati 3,  neck circumference:19. Nasal airflow patent , TMJ is not  evident . Retrognathia is not seen.  Cardiovascular:  Regular rate and rhythm, without  murmurs or carotid bruit, and without distended neck veins. Respiratory: Lungs are clear to auscultation. Skin:  Without evidence of edema, or rash Trunk: BMI is 35 The patient's posture is slightly stooped.    Neurologic exam : The patient is awake and alert,  oriented to place and time.   Memory subjective  described as intact.   Attention span & concentration ability appears normal.  Speech is fluent,  without  dysarthria, dysphonia or aphasia.  Mood and affect are appropriate.  Cranial nerves: Pupils are equal and briskly reactive to light. Funduscopic exam without  evidence of pallor  or edema. Extraocular movements  in vertical and horizontal planes intact and without nystagmus. Visual fields by finger perimetry are intact. Hearing to finger rub intact.   Facial sensation intact to fine touch.  Facial motor strength is symmetric and tongue and uvula move midline. Shoulder shrug was symmetrical.   Motor exam:  Elevated  Tone with bilateral cog-wheeling. He has symmetric  muscle bulk and symmetric strength in all extremities.  Sensory:  Fine touch, pinprick and vibration were tested in all extremities. Proprioception tested in the upper extremities was normal.  Coordination: Rapid alternating movements in the fingers/hands was normal. Finger-to-nose maneuver  normal without evidence of ataxia, dysmetria -but non resting  tremor.  Gait and station: Patient walks without assistive device and is able unassisted to climb up to the exam table. Strength within normal limits. Bilaterally preserved arm swing with each step. He does not get dizzy, he can walk on a straight line.  Stance is stable and normal.  Turns with 3-4   Steps. Romberg testing is  negative.  Deep tendon reflexes: in the  upper and lower extremities are symmetric and intact. Babinski maneuver response is downgoing.  Dear Dr. Rex Andrade, I had the benefit of reviewing Dr. Jeneen Rinks Osborne's note from 2015 and from 2016.  The patient clearly has a circadian rhythm problem seems to be cyclic in nature sometimes it's worse sometimes it gets better and I would in any cyclic circadian rhythm disorder recommend for the patient to stay on a antidepressant medication.  This is not unspecified  insomnia this is insomnia related to a circadian rhythm disorder. I reviewed the comorbidities which include chronic renal insufficiency stage III, hyperlipidemia, he is actively still chewing tobacco.  And he seems to have a mild, essential tremor which affects both upper extremities the same, is not associated with strength loss and his muscle bulk is symmetric. There is no resting tremor noted. The tremor bothers him when he performs an action.  His father had essential tremor so an inherited  pathway is very likely.  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  No daytime naps, no daytime sleepiness is associated.   2) Essential tremor.  3) Lunesta was habit forming.    The patient was advised of the nature of the diagnosed disorder , the treatment options and the  risks for general health and wellness arising from not treating the condition.  I have discussed with Mr. Grenda that his tremor may not bother him enough to justify medication treatment. However the usual tremor medications are sleep inducing, such as Mysoline, beta blockers, and there are other alternatives such as topiramate. Given his history of renal insufficiency we have to weigh the benefits of a sleep aid with the side effects and the metabolic clearance rate. I would much rather have the patient adhered to a rise time in the morning at 9 AM and use a light box to install daytime activity than to use the medication. He does not fall inadvertently asleep during the day. I would like for Mr. Partch to try the light box in conjunction with Ambien at night. He will  use a 5  mg dose in a non-extended release form. The clearance for this medication will be between 6 and 8 hours, I expect him not to operate machinery for that period of time.  I spent more than 60 minutes of face to face time with the patient.  Greater than 50% of time was spent in counseling and coordination of care. We have discussed the diagnosis and differential and I answered the patient's questions.    Plan:  Treatment plan and additional workup :  Light box, use every morning for 30 minutes from 9 to 9.30 AM. Ambien 30 days on 5 mg take at 11 PM.  RV in June with Np.  Return light box.  PLAN B:  if not improving, will need to see sleep  psychologist   Larey Seat, MD 04/26/252, 27:06 AM  Certified in Neurology by ABPN Certified in [place] by [facility] [address], [place], [place] [address]     [place] Ethan Andrade , MD at [facility]

## 2016-06-27 NOTE — Patient Instructions (Addendum)
Please remember to try to maintain good sleep hygiene, which means: Keep a regular sleep and wake schedule, try not to exercise or have a meal within 2 hours of your bedtime, try to keep your bedroom conducive for sleep, that is, cool and dark, without light distractors such as an illuminated alarm clock, and refrain from watching TV right before sleep or in the middle of the night and do not keep the TV or radio on during the night. Also, try not to use or play on electronic devices at bedtime, such as your cell phone, tablet PC or laptop. If you like to read at bedtime on an electronic device, try to dim the background light as much as possible. Do not eat in the middle of the night.    For chronic insomnia, you are best followed by a psychiatrist and/or sleep psychologist.   Zolpidem tablets What is this medicine? ZOLPIDEM (zole PI dem) is used to treat insomnia. This medicine helps you to fall asleep and sleep through the night. This medicine may be used for other purposes; ask your health care provider or pharmacist if you have questions. COMMON BRAND NAME(S): Ambien What should I tell my health care provider before I take this medicine? They need to know if you have any of these conditions: -depression -history of drug abuse or addiction -if you often drink alcohol -liver disease -lung or breathing disease -myasthenia gravis -sleep apnea -suicidal thoughts, plans, or attempt; a previous suicide attempt by you or a family member -an unusual or allergic reaction to zolpidem, other medicines, foods, dyes, or preservatives -pregnant or trying to get pregnant -breast-feeding How should I use this medicine? Take this medicine by mouth with a glass of water. Follow the directions on the prescription label. It is better to take this medicine on an empty stomach and only when you are ready for bed. Do not take your medicine more often than directed. If you have been taking this medicine for  several weeks and suddenly stop taking it, you may get unpleasant withdrawal symptoms. Your doctor or health care professional may want to gradually reduce the dose. Do not stop taking this medicine on your own. Always follow your doctor or health care professional's advice. A special MedGuide will be given to you by the pharmacist with each prescription and refill. Be sure to read this information carefully each time. Talk to your pediatrician regarding the use of this medicine in children. Special care may be needed. Overdosage: If you think you have taken too much of this medicine contact a poison control center or emergency room at once. NOTE: This medicine is only for you. Do not share this medicine with others. What if I miss a dose? This does not apply. This medicine should only be taken immediately before going to sleep. Do not take double or extra doses. What may interact with this medicine? -alcohol -antihistamines for allergy, cough and cold -certain medicines for anxiety or sleep -certain medicines for depression, like amitriptyline, fluoxetine, sertraline -certain medicines for fungal infections like ketoconazole and itraconazole -certain medicines for seizures like phenobarbital, primidone -ciprofloxacin -dietary supplements for sleep, like valerian or kava kava -general anesthetics like halothane, isoflurane, methoxyflurane, propofol -local anesthetics like lidocaine, pramoxine, tetracaine -medicines that relax muscles for surgery -narcotic medicines for pain -phenothiazines like chlorpromazine, mesoridazine, prochlorperazine, thioridazine -rifampin This list may not describe all possible interactions. Give your health care provider a list of all the medicines, herbs, non-prescription drugs, or dietary supplements you  use. Also tell them if you smoke, drink alcohol, or use illegal drugs. Some items may interact with your medicine. What should I watch for while using this  medicine? Visit your doctor or health care professional for regular checks on your progress. Keep a regular sleep schedule by going to bed at about the same time each night. Avoid caffeine-containing drinks in the evening hours. When sleep medicines are used every night for more than a few weeks, they may stop working. Talk to your doctor if you still have trouble sleeping. After taking this medicine for sleep, you may get up out of bed while not being fully awake and do an activity that you do not know you are doing. The next morning, you may have no memory of the event. Activities such as driving a car ("sleep-driving"), making and eating food, talking on the phone, sexual activity, and sleep-walking have been reported. Call your doctor right away if you find out you have done any of these activities. Do not take this medicine if you have used alcohol that evening or before bed or taken another medicine for sleep since your risk of doing these sleep-related activities will be increased. Wait for at least 8 hours after you take a dose before driving or doing other activities that require full mental alertness. Do not take this medicine unless you are able to stay in bed for a full night (7 to 8 hours) before you must be active again. You may have a decrease in mental alertness the day after use, even if you feel that you are fully awake. Tell your doctor if you will need to perform activities requiring full alertness, such as driving, the next day. Do not stand or sit up quickly after taking this medicine, especially if you are an older patient. This reduces the risk of dizzy or fainting spells. If you or your family notice any changes in your behavior, such as new or worsening depression, thoughts of harming yourself, anxiety, other unusual or disturbing thoughts, or memory loss, call your doctor right away. After you stop taking this medicine, you may have trouble falling asleep. This is called rebound  insomnia. This problem usually goes away on its own after 1 or 2 nights. What side effects may I notice from receiving this medicine? Side effects that you should report to your doctor or health care professional as soon as possible: -allergic reactions like skin rash, itching or hives, swelling of the face, lips, or tongue -breathing problems -changes in vision -confusion -depressed mood or other changes in moods or emotions -feeling faint or lightheaded, falls -hallucinations -loss of balance or coordination -loss of memory -numbness or tingling of the tongue -restlessness, excitability, or feelings of anxiety or agitation -signs and symptoms of liver injury like dark yellow or brown urine; general ill feeling or flu-like symptoms; light-colored stools; loss of appetite; nausea; right upper belly pain; unusually weak or tired; yellowing of the eyes or skin -suicidal thoughts -unusual activities while asleep like driving, eating, making phone calls, or sexual activity Side effects that usually do not require medical attention (report to your doctor or health care professional if they continue or are bothersome): -dizziness -drowsiness the day after you take this medicine -headache This list may not describe all possible side effects. Call your doctor for medical advice about side effects. You may report side effects to FDA at 1-800-FDA-1088. Where should I keep my medicine? Keep out of the reach of children. This medicine can  be abused. Keep your medicine in a safe place to protect it from theft. Do not share this medicine with anyone. Selling or giving away this medicine is dangerous and against the law. This medicine may cause accidental overdose and death if taken by other adults, children, or pets. Mix any unused medicine with a substance like cat litter or coffee grounds. Then throw the medicine away in a sealed container like a sealed bag or a coffee can with a lid. Do not use the  medicine after the expiration date. Store at room temperature between 20 and 25 degrees C (68 and 77 degrees F). NOTE: This sheet is a summary. It may not cover all possible information. If you have questions about this medicine, talk to your doctor, pharmacist, or health care provider.  2018 [employer]vier/Gold Standard (2015-06-14 14:38:20)

## 2016-07-18 ENCOUNTER — Telehealth: Payer: Self-pay | Admitting: Neurology

## 2016-07-18 NOTE — Telephone Encounter (Signed)
I spoke to patient. He states that the 10mg  works "way better" for him. I advised him that you return next week and can advise if ok to increase dosage. Patient needs a refill.

## 2016-07-18 NOTE — Telephone Encounter (Signed)
Pt calling re: zolpidem (AMBIEN) 5 MG tablet he said this dosage of 5 mg is not helping him.  Pt said he took 2 last night and he slept very well. Please call to advise if a new perscription can be called into  [facility] [place], [place] - [address] [phone] (Phone) [phone] (Fax)

## 2016-07-22 NOTE — Telephone Encounter (Signed)
Patient called office in reference to see if he is going to be able to have the increase 10mg  on his Ambien.  Please call

## 2016-07-22 NOTE — Telephone Encounter (Signed)
This request has already been sent to Dr. Brett Fairy.

## 2016-07-23 MED ORDER — ZOLPIDEM TARTRATE 10 MG PO TABS: ORAL_TABLET | ORAL | 1 refills | Status: DC

## 2016-07-23 NOTE — Addendum Note (Signed)
Addended by: Larey Seat on: 07/23/2016 03:52 PM   Modules accepted: Orders

## 2016-07-23 NOTE — Telephone Encounter (Signed)
ambien increased to 10 mg prn at night, do not exceed 5 a week.

## 2016-07-23 NOTE — Telephone Encounter (Signed)
Pt wife calling back because pt was unable to sleep last night because of being without medication.  Pt wife stating they were calling last week on this request as well.  I informed her Dr was out of office and just returned on yesterday, please call when medication Rx is ready

## 2016-07-23 NOTE — Telephone Encounter (Signed)
I called pt and advised him that Dr. Brett Fairy has increased his ambien to 10 mg daily prn but he should not take the [place] more than 5 times per week. Pt verbalized understanding of this.

## 2016-08-28 ENCOUNTER — Ambulatory Visit (INDEPENDENT_AMBULATORY_CARE_PROVIDER_SITE_OTHER): Payer: Medicare HMO | Admitting: Adult Health

## 2016-08-28 ENCOUNTER — Encounter: Payer: Self-pay | Admitting: Adult Health

## 2016-08-28 ENCOUNTER — Encounter (INDEPENDENT_AMBULATORY_CARE_PROVIDER_SITE_OTHER): Payer: Self-pay

## 2016-08-28 VITALS — BP 130/72 | HR 72 | Ht 69.5 in | Wt 234.8 lb

## 2016-08-28 DIAGNOSIS — G4720 Circadian rhythm sleep disorder, unspecified type: Secondary | ICD-10-CM

## 2016-08-28 MED ORDER — ZOLPIDEM TARTRATE 10 MG PO TABS: 10.0000 mg | ORAL_TABLET | Freq: Every evening | ORAL | 3 refills | Status: DC | PRN

## 2016-08-28 NOTE — Patient Instructions (Signed)
May take Ambien 10 mg at bedtime. Taking the medication nightly can be habit forming. Taking Ambien for daily for an extended period of time may cause the medication to lose it's effect.   There is a risk for parasomnias on Ambien: so as sleep walking  Zolpidem tablets What is this medicine? ZOLPIDEM (zole PI dem) is used to treat insomnia. This medicine helps you to fall asleep and sleep through the night. This medicine may be used for other purposes; ask your health care provider or pharmacist if you have questions. COMMON BRAND NAME(S): Ambien What should I tell my health care provider before I take this medicine? They need to know if you have any of these conditions: -depression -history of drug abuse or addiction -if you often drink alcohol -liver disease -lung or breathing disease -myasthenia gravis -sleep apnea -suicidal thoughts, plans, or attempt; a previous suicide attempt by you or a family member -an unusual or allergic reaction to zolpidem, other medicines, foods, dyes, or preservatives -pregnant or trying to get pregnant -breast-feeding How should I use this medicine? Take this medicine by mouth with a glass of water. Follow the directions on the prescription label. It is better to take this medicine on an empty stomach and only when you are ready for bed. Do not take your medicine more often than directed. If you have been taking this medicine for several weeks and suddenly stop taking it, you may get unpleasant withdrawal symptoms. Your doctor or health care professional may want to gradually reduce the dose. Do not stop taking this medicine on your own. Always follow your doctor or health care professional's advice. A special MedGuide will be given to you by the pharmacist with each prescription and refill. Be sure to read this information carefully each time. Talk to your pediatrician regarding the use of this medicine in children. Special care may be needed. Overdosage:  If you think you have taken too much of this medicine contact a poison control center or emergency room at once. NOTE: This medicine is only for you. Do not share this medicine with others. What if I miss a dose? This does not apply. This medicine should only be taken immediately before going to sleep. Do not take double or extra doses. What may interact with this medicine? -alcohol -antihistamines for allergy, cough and cold -certain medicines for anxiety or sleep -certain medicines for depression, like amitriptyline, fluoxetine, sertraline -certain medicines for fungal infections like ketoconazole and itraconazole -certain medicines for seizures like phenobarbital, primidone -ciprofloxacin -dietary supplements for sleep, like valerian or kava kava -general anesthetics like halothane, isoflurane, methoxyflurane, propofol -local anesthetics like lidocaine, pramoxine, tetracaine -medicines that relax muscles for surgery -narcotic medicines for pain -phenothiazines like chlorpromazine, mesoridazine, prochlorperazine, thioridazine -rifampin This list may not describe all possible interactions. Give your health care provider a list of all the medicines, herbs, non-prescription drugs, or dietary supplements you use. Also tell them if you smoke, drink alcohol, or use illegal drugs. Some items may interact with your medicine. What should I watch for while using this medicine? Visit your doctor or health care professional for regular checks on your progress. Keep a regular sleep schedule by going to bed at about the same time each night. Avoid caffeine-containing drinks in the evening hours. When sleep medicines are used every night for more than a few weeks, they may stop working. Talk to your doctor if you still have trouble sleeping. After taking this medicine for sleep, you may get up  out of bed while not being fully awake and do an activity that you do not know you are doing. The next morning, you  may have no memory of the event. Activities such as driving a car ("sleep-driving"), making and eating food, talking on the phone, sexual activity, and sleep-walking have been reported. Call your doctor right away if you find out you have done any of these activities. Do not take this medicine if you have used alcohol that evening or before bed or taken another medicine for sleep since your risk of doing these sleep-related activities will be increased. Wait for at least 8 hours after you take a dose before driving or doing other activities that require full mental alertness. Do not take this medicine unless you are able to stay in bed for a full night (7 to 8 hours) before you must be active again. You may have a decrease in mental alertness the day after use, even if you feel that you are fully awake. Tell your doctor if you will need to perform activities requiring full alertness, such as driving, the next day. Do not stand or sit up quickly after taking this medicine, especially if you are an older patient. This reduces the risk of dizzy or fainting spells. If you or your family notice any changes in your behavior, such as new or worsening depression, thoughts of harming yourself, anxiety, other unusual or disturbing thoughts, or memory loss, call your doctor right away. After you stop taking this medicine, you may have trouble falling asleep. This is called rebound insomnia. This problem usually goes away on its own after 1 or 2 nights. What side effects may I notice from receiving this medicine? Side effects that you should report to your doctor or health care professional as soon as possible: -allergic reactions like skin rash, itching or hives, swelling of the face, lips, or tongue -breathing problems -changes in vision -confusion -depressed mood or other changes in moods or emotions -feeling faint or lightheaded, falls -hallucinations -loss of balance or coordination -loss of memory -numbness  or tingling of the tongue -restlessness, excitability, or feelings of anxiety or agitation -signs and symptoms of liver injury like dark yellow or brown urine; general ill feeling or flu-like symptoms; light-colored stools; loss of appetite; nausea; right upper belly pain; unusually weak or tired; yellowing of the eyes or skin -suicidal thoughts -unusual activities while asleep like driving, eating, making phone calls, or sexual activity Side effects that usually do not require medical attention (report to your doctor or health care professional if they continue or are bothersome): -dizziness -drowsiness the day after you take this medicine -headache This list may not describe all possible side effects. Call your doctor for medical advice about side effects. You may report side effects to FDA at 1-800-FDA-1088. Where should I keep my medicine? Keep out of the reach of children. This medicine can be abused. Keep your medicine in a safe place to protect it from theft. Do not share this medicine with anyone. Selling or giving away this medicine is dangerous and against the law. This medicine may cause accidental overdose and death if taken by other adults, children, or pets. Mix any unused medicine with a substance like cat litter or coffee grounds. Then throw the medicine away in a sealed container like a sealed bag or a coffee can with a lid. Do not use the medicine after the expiration date. Store at room temperature between 20 and 25 degrees C (68  and 77 degrees F). NOTE: This sheet is a summary. It may not cover all possible information. If you have questions about this medicine, talk to your doctor, pharmacist, or health care provider.  2018 Elsevier/Gold Standard (2015-06-14 14:38:20)

## 2016-08-28 NOTE — Progress Notes (Signed)
PATIENT: Ethan Andrade DOB: [birth year]  REASON FOR VISIT: follow up- circadian rhythm disorder HISTORY FROM: patient  HISTORY OF PRESENT ILLNESS: Ethan Andrade is a 81 year old male with a history of circadian rhythm disorder. He returns today for follow-up. He is currently taking Ambien 10 mg at bedtime. He states he was instructed that he could only take the medication 5 times a week. He states that when he takes medication 3 or 4 days in a row he gets approximately 8-10 hours of sleep and is able to establish a regular sleep routine. He states that the 2 days that he does not take the medication he is unable to sleep at all and for this reason it breaks his sleep routine. The patient would like to take Ambien 10 mg nightly. He reports some nights he does not get to  better before 2 AM. He states that if is able to use Ambien he thinks he would be able to go to bed earlier as he would be sleeping 8-10 hours a night. He denies any new neurological symptoms. He returns today for an evaluation.   HISTORY 06/27/16: Ethan Andrade is a 81 y.o. male , seen here as in a referral to our sleep clinic,  from Ethan Andrade , referral for Insomnia (and Tremor).  Chief complaint according to patient : Trembling, and cannot go to sleep. Feeling fatigued.   Ethan Andrade carries a diagnosis of circadian rhythm sleep disorder, The manifestation of a circadian rhythm disorder is usually but the patient cannot sleep at night and sleeps best in the morning hours or daytime. This can be a form of insomnia. 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 This is an acquired form of a circadian rhythm disorder. He has followed Ethan Andrade for the last 2-3 years. Ethan Andrade had initiated a regimen including Lunesta as a sleep aid and SSRI antidepressants. Ethan Andrade had implemented a certain sleep routine advancing the bedtime gradually over a period of  time. Before the patient saw Ethan Andrade in his bedtime could be as late as 4 AM. About 3 years ago they begun changing his bedtime with the help of the above medications, but the patient states that Ethan Andrade and stopped working for him. In addition, he became ill and couldn't leave the bed for about 3 days, which resets his inner clock. He also struggles with each time change. Ethan Andrade does not want to prescribe sleep aids. His wife has noted jerking movements and trembling in his sleep. The amplitude is strong enough to wake her up.     My Colleague , Ethan Andrade , is a MD, PhD Movement disorder specialist in the [facility]. I may need her input .    Sleep habits are as follows: Just recently the patient has been able to sleep only every other day but not at night. He goes at least 24 hours without sleep sometimes 36. He is known to sometimes not sleep t2-3 days in a row. This affects his social life of the family and marriage. Currently he goes to bed around 2 AM trying to go to sleep- but not always successfully. Sometimes he has by choice decided not to sleep in daytime and usually can then sleep some hours at night. He does not take daytime naps. If he goes to sleep at 2 AM he will stay asleep until his wife wakes him  up at 12:00 or 13.00 hours. No snoring, No apnea.    Sleep medical history and family sleep history:  No family history of sleep disorders . Oldest sister takes a sleep aid.  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  He has a very remote history of smoking and quit at Andrade 6, is been a nonsmoker for 50+ years. He quit drinking the same year as he quit smoking. He does not drink caffeine native beverages of any kind.  REVIEW OF SYSTEMS: Out of a complete 14 system review of symptoms, the patient  complains only of the following symptoms, and all other reviewed systems are negative. Fatigue severity score 23 Epworth sleepiness score 0 Insomnia, back pain, tremors  ALLERGIES: No Known Allergies  HOME MEDICATIONS: Outpatient Medications Prior to Visit  Medication Sig Dispense Refill  . aspirin 81 MG chewable tablet Chew 81 mg by mouth daily.    Marland Kitchen escitalopram (LEXAPRO) 10 MG tablet     . fish oil-omega-3 fatty acids 1000 MG capsule Take 2 g by mouth 2 (two) times daily.    Marland Kitchen lisinopril (PRINIVIL,ZESTRIL) 10 MG tablet TK 1 T PO QD  1  . Multiple Vitamins-Minerals (CENTRUM SILVER PO) Take 1 tablet by mouth daily.    . saw palmetto 500 MG capsule Take 500 mg by mouth daily.    Marland Kitchen zolpidem (AMBIEN) 10 MG tablet Take one tab at night prn, do not exceed 5 a week. 20 tablet 1   No facility-administered medications prior to visit.     PAST MEDICAL HISTORY: Past Medical History:  Diagnosis Date  . Epididymitis   . Renal disorder    creatinine levels are high    PAST SURGICAL HISTORY: Past Surgical History:  Procedure Laterality Date  . CHOLECYSTECTOMY    . ELBOW SURGERY    . HEMORROIDECTOMY    . VASECTOMY      FAMILY HISTORY: Family History  Problem Relation Andrade of Onset  . Other Brother        prostate cancer  . Other Brother        prostate cancer    SOCIAL HISTORY: Social History   Social History  . Marital status: Married    Spouse name: N/A  . Number of children: 2  . Years of education: HS   Occupational History  . Retired    Social History Main Topics  . Smoking status: Former Research scientist (life sciences)  . Smokeless tobacco: Current User    Types: Chew  . Alcohol use Yes     Comment: Rare  . Drug use: No  . Sexual activity: No   Other Topics Concern  . Not on file   Social History Narrative   Caffeine use: rare       PHYSICAL EXAM  Vitals:   08/28/16 1356  BP: 130/72  Pulse: 72  Weight: 234 lb 12.8 oz (106.5 kg)  Height: 5' 9.5" (1.765 m)   Body  mass index is 34.18 kg/m.  Generalized: Well developed, in no acute distress   Neurological examination  Mentation: Alert oriented to time, place, history taking. Follows all commands speech and language fluent Cranial nerve II-XII: Pupils were equal round reactive to light. Extraocular movements were full, visual field were full on confrontational test. Facial sensation and strength were normal. Uvula tongue midline. Head turning and shoulder  shrug  were normal and symmetric. Motor: The motor testing reveals 5 over 5 strength of all 4 extremities. Good symmetric motor tone is noted throughout.  Sensory: Sensory testing is intact to soft touch on all 4 extremities. No evidence of extinction is noted.  Coordination: Cerebellar testing reveals good finger-nose-finger and heel-to-shin bilaterally.  Gait and station: Gait is normal.  Reflexes: Deep tendon reflexes are symmetric and normal bilaterally.   DIAGNOSTIC DATA (LABS, IMAGING, TESTING) - I reviewed patient records, labs, notes, testing and imaging myself where available.  Lab Results  Component Value Date   WBC 11.4 (H) 11/17/2011   HGB 12.9 (L) 11/17/2011   HCT 36.8 (L) 11/17/2011   MCV 93.9 11/17/2011   PLT 178 11/17/2011      Component Value Date/Time   NA 134 (L) 11/16/2011 0420   K 3.9 11/16/2011 0420   CL 104 11/16/2011 0420   CO2 18 (L) 11/16/2011 0420   GLUCOSE 101 (H) 11/16/2011 0420   BUN 18 11/16/2011 0420   CREATININE 1.49 (H) 11/16/2011 0420   CALCIUM 8.3 (L) 11/16/2011 0420   PROT 5.7 (L) 11/15/2011 0333   ALBUMIN 2.9 (L) 11/15/2011 0333   AST 21 11/15/2011 0333   ALT 16 11/15/2011 0333   ALKPHOS 68 11/15/2011 0333   BILITOT 0.6 11/15/2011 0333   GFRNONAA 44 (L) 11/16/2011 0420   GFRAA 52 (L) 11/16/2011 0420      ASSESSMENT AND PLAN 81 y.o. year old male  has a past medical history of Epididymitis and Renal disorder. here with:  1. Circadian rhythm disorder  I discussed the patient's medication  regimen with Ethan. Brett Fairy. We will give the patient a 90 day trial on Ambien 10 mg at bedtime. Ideally we would like him to refrain from using it nightly however he is unable to sleep the nights without it. I explained to the patient that Ambien can be habit forming. There is also a risk for parasomnias on this medication including sleepwalking, participating in activities and having no recollection of those activities. For example driving,  sexual activity and talking on the phone. Patient verbalized understanding. He reports that he does understand the risk associated with this medication. He will follow-up in 3 months with Ethan. Brett Fairy  I spent 15 minutes with the patient. 50% of this time was spent reviewing risks associated with Ambien.     Ward Givens, MSN, NP-C 08/28/2016, 2:06 PM [facility] [address], [place], [place] [address] [phone]

## 2016-08-29 NOTE — Progress Notes (Signed)
I agree with the assessment and plan as directed by NP .The patient is known to me .   Elliot Simoneaux, MD  

## 2016-12-17 ENCOUNTER — Ambulatory Visit (INDEPENDENT_AMBULATORY_CARE_PROVIDER_SITE_OTHER): Payer: Medicare HMO | Admitting: Neurology

## 2016-12-17 ENCOUNTER — Encounter: Payer: Self-pay | Admitting: Neurology

## 2016-12-17 VITALS — BP 125/78 | HR 76 | Ht 69.0 in | Wt 233.0 lb

## 2016-12-17 DIAGNOSIS — G4724 Circadian rhythm sleep disorder, free running type: Secondary | ICD-10-CM | POA: Diagnosis not present

## 2016-12-17 MED ORDER — TASIMELTEON 20 MG PO CAPS: ORAL_CAPSULE | ORAL | 5 refills | Status: DC

## 2016-12-17 MED ORDER — ZOLPIDEM TARTRATE 10 MG PO TABS: 10.0000 mg | ORAL_TABLET | Freq: Every evening | ORAL | 3 refills | Status: DC | PRN

## 2016-12-17 NOTE — Progress Notes (Signed)
PATIENT: Ethan Andrade DOB: 1935/05/05  REASON FOR VISIT: follow up- circadian rhythm disorder HISTORY FROM: patient  HISTORY OF PRESENT ILLNESS:  81 year old patient with circadian rhythm disorder- thought to reflect non 24 hour disorder, free running type.  Can not sleep before 3-4 AM, and seems to sleep in daytime. Has tried sleep suppression Patient used Light box without success, he has used Ambien 10 mg 5 days a week but has also noticed that the efficacy has weaned he is not responding to Ambien every night he takes it. Actually,  more often sleeps still in daytime than at nighttime. He is not bothered to not go to sleep, but his wife is. His social life - and hers - are affected.  He is having trouble with sleep at night, not trouble to stay awake - cyclic good sleep.    Tremor not evident. Progressive kidney diease CKD stage 2 and  anemia, was seen at the nephrologist ? He reports he was told he had no concerning renal disease (?), [place] [facility]. Dr. Erling Cruz. HTN , not diabetic.     Ethan Andrade is a 81 year old male with a history of circadian rhythm disorder. He returns today for follow-up. He is currently taking Ambien 10 mg at bedtime. He states he was instructed that he could only take the medication 5 times a week. He states that when he takes medication 3 or 4 days in a row he gets approximately 8-10 hours of sleep and is able to establish a regular sleep routine. He states that the 2 days that he does not take the medication he is unable to sleep at all and for this reason it breaks his sleep routine. The patient would like to take Ambien 10 mg nightly. He reports some nights he does not get to  better before 2 AM. He states that if is able to use Ambien he thinks he would be able to go to bed earlier as he would be sleeping 8-10 hours a night. He denies any new neurological symptoms. He returns today for an evaluation.   HISTORY 06/27/16: Ethan Andrade is a 81 y.o. male ,  seen here as in a referral to our sleep clinic, from Dr. Hulan Fess , referral for Insomnia (and Tremor). Chief complaint according to patient : Trembling, and cannot go to sleep. Feeling fatigued.  Ethan Andrade carries a diagnosis of circadian rhythm sleep disorder, The manifestation of a circadian rhythm disorder is usually but the patient cannot sleep at night and sleeps best in the morning hours or daytime. This can be a form of insomnia. 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 This is an acquired form of a circadian rhythm disorder. He has followed Dr. Maxwell Caul for the last 2-3 years. Dr. Maxwell Caul had initiated a regimen including Lunesta as a sleep aid and SSRI antidepressants. Dr. Maxwell Caul had implemented a certain sleep routine advancing the bedtime gradually over a period of time. Before the patient saw Dr. Maxwell Caul in his bedtime could be as late as 4 AM. About 3 years ago they begun changing his bedtime with the help of the above medications, but the patient states that Johnnye Sima so and stopped working for him. In addition, he became ill and couldn't leave the bed for about 3 days, which resets his inner clock. He also struggles with each time change. Dr Rex Kras does not want to prescribe sleep  aids. His wife has noted jerking movements and trembling in his sleep. The amplitude is strong enough to wake her up.      Sleep habits are as follows: Just recently the patient has been able to sleep only every other day but not at night. He goes at least 24 hours without sleep sometimes 36. He is known to sometimes not sleep t2-3 days in a row. This affects his social life of the family and marriage. Currently he goes to bed around 2 AM trying to go to sleep- but not always successfully. Sometimes he has by choice decided not to sleep in daytime and usually can then sleep some hours at night. He does not take daytime naps. If he goes to sleep  at 2 AM he will stay asleep until his wife wakes him up at 12:00 or 13.00 hours. No snoring, No apnea.    Sleep medical history and family sleep history:  No family history of sleep disorders . Oldest sister takes a sleep aid.  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   Social history: married, former railroad Engineer, technical sales.  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  He has a very remote history of smoking and quit at age 65, is been a nonsmoker for 50+ years. He quit drinking the same year as he quit smoking. He does not drink caffeine native beverages of any kind.  REVIEW OF SYSTEMS: Out of a complete 14 system review of symptoms, the patient complains only of the following symptoms, and all other reviewed systems are negative. Fatigue severity score 23 Epworth sleepiness score 0 Insomnia, back pain, tremors  ALLERGIES: No Known Allergies  HOME MEDICATIONS: Outpatient Medications Prior to Visit  Medication Sig Dispense Refill  . aspirin 81 MG chewable tablet Chew 81 mg by mouth daily.    Marland Kitchen escitalopram (LEXAPRO) 10 MG tablet     . fish oil-omega-3 fatty acids 1000 MG capsule Take 2 g by mouth 2 (two) times daily.    Marland Kitchen lisinopril (PRINIVIL,ZESTRIL) 10 MG tablet TK 1 T PO QD  1  . Multiple Vitamins-Minerals (CENTRUM SILVER PO) Take 1 tablet by mouth daily.    . saw palmetto 500 MG capsule Take 500 mg by mouth daily.    Marland Kitchen zolpidem (AMBIEN) 10 MG tablet Take 1 tablet (10 mg total) by mouth at bedtime as needed for sleep. 30 tablet 3   No facility-administered medications prior to visit.     PAST MEDICAL HISTORY: Past Medical History:  Diagnosis Date  . Epididymitis   . Renal disorder    creatinine levels are high    PAST SURGICAL HISTORY: Past Surgical History:  Procedure Laterality Date  . CHOLECYSTECTOMY    . ELBOW SURGERY    . HEMORROIDECTOMY    . VASECTOMY      FAMILY HISTORY: Family  History  Problem Relation Age of Onset  . Other Brother        prostate cancer  . Other Brother        prostate cancer    SOCIAL HISTORY: Social History   Social History  . Marital status: Married    Spouse name: N/A  . Number of children: 2  . Years of education: HS   Occupational History  . Retired    Social History Main Topics  . Smoking status: Former Research scientist (life sciences)  . Smokeless tobacco: Current User    Types: Chew  .  Alcohol use Yes     Comment: Rare  . Drug use: No  . Sexual activity: No   Other Topics Concern  . Not on file   Social History Narrative   Caffeine use: rare       PHYSICAL EXAM  Vitals:   12/17/16 1331  BP: 125/78  Pulse: 76  Weight: 233 lb (105.7 kg)  Height: 5\' 9"  (1.753 m)   Body mass index is 34.41 kg/m.  Generalized: Well developed, in no acute distress . Well groomed.   Neurological examination  General: The patient is awake, alert and appears not in acute distress. The patient is well groomed. Head: Normocephalic, atraumatic. Neck is supple. Mallampati 4, neck circumference:18.5  Cardiovascular:  Regular rate and rhythm , without  murmurs or carotid bruit, and without distended neck veins. Respiratory: Lungs are clear to auscultation. Skin:  Without evidence of edema, or rash Trunk: BMI is 34 ,elevated with normal posture.   Neurologic exam : The patient is awake and alert, oriented to place and time.  Memory subjective  described as intact. There is a normal attention span & concentration ability. Speech is fluent without  Dysarthria, but mild  dysphonia is present . Mood and affect are appropriate.  Cranial nerves:  sense of taste has changed- smell is intact. Pupils are equal and briskly reactive to light.Extraocular movements  in vertical and horizontal planes intact and without nystagmus. Visual fields by finger perimetry are intact.Hearing to finger rub intact.  Facial sensation intact to fine touch. Facial motor strength is  symmetric and tongue and uvula move midline. Motor exam:   Normal tone and normal muscle bulk and symmetric normal strength in all extremities. Sensory:  Fine touch, pinprick and vibration were tested in all extremities. Proprioception is tested in the upper extremities only. This was  normal. Coordination: Rapid alternating movements in the fingers/hands is tested and normal. Finger-to-nose maneuver tested and normal without evidence of ataxia, dysmetria or tremor. Gait and station: Patient walks without assistive device . Romberg testing is normal. Deep tendon reflexes: in the  upper and lower extremities are symmetric and intact.   DIAGNOSTIC DATA (LABS, IMAGING, TESTING) - I reviewed patient records, labs, notes, testing and imaging myself where available.  Lab Results  Component Value Date   WBC 11.4 (H) 11/17/2011   HGB 12.9 (L) 11/17/2011   HCT 36.8 (L) 11/17/2011   MCV 93.9 11/17/2011   PLT 178 11/17/2011      ASSESSMENT AND PLAN:   1. Circadian rhythm disorder, non 24 hours . 2. CKD grade 1 or 2 per patients report.  3  CMET and CBC before trial of HETLIOZ.   I discussed the patient's medication regimen .  I explained to the patient that Ambien can be habit forming.  There is also a risk for parasomnias on this medication including sleepwalking, participating in activities and having no recollection of those activities. For example driving,  sexual activity and talking on the phone. Patient verbalized understanding. He reports that he does understand the risk associated with this medication.  He will try Hetlioz for non 24 hour free running sleep disorder.  Hetlioz 20 mg is taken at bed time same time every night, patient agrees.   I spent 30 minutes with the patient. 50% of this time was spent reviewing risks associated with Ambien and Hetlioz = he will follow in 3 month with NP.   Larey Seat, MD   12/17/2016, 1:32 PM [facility] [phone]  [address] [place], [place] [address] [phone]

## 2016-12-18 ENCOUNTER — Telehealth: Payer: Self-pay | Admitting: Neurology

## 2016-12-18 LAB — CBC WITH DIFFERENTIAL/PLATELET
BASOS: 1 %
Basophils Absolute: 0.1 10*3/uL (ref 0.0–0.2)
EOS (ABSOLUTE): 0.2 10*3/uL (ref 0.0–0.4)
EOS: 2 %
HEMATOCRIT: 44.6 % (ref 37.5–51.0)
Hemoglobin: 15 g/dL (ref 13.0–17.7)
Immature Grans (Abs): 0 10*3/uL (ref 0.0–0.1)
Immature Granulocytes: 0 %
LYMPHS ABS: 3.5 10*3/uL — AB (ref 0.7–3.1)
Lymphs: 35 %
MCH: 32.8 pg (ref 26.6–33.0)
MCHC: 33.6 g/dL (ref 31.5–35.7)
MCV: 98 fL — AB (ref 79–97)
Monocytes Absolute: 0.7 10*3/uL (ref 0.1–0.9)
Monocytes: 7 %
NEUTROS ABS: 5.5 10*3/uL (ref 1.4–7.0)
Neutrophils: 55 %
Platelets: 312 10*3/uL (ref 150–379)
RBC: 4.57 x10E6/uL (ref 4.14–5.80)
RDW: 13.8 % (ref 12.3–15.4)
WBC: 10 10*3/uL (ref 3.4–10.8)

## 2016-12-18 LAB — COMPREHENSIVE METABOLIC PANEL
ALT: 19 IU/L (ref 0–44)
AST: 21 IU/L (ref 0–40)
Albumin/Globulin Ratio: 1.8 (ref 1.2–2.2)
Albumin: 4.2 g/dL (ref 3.5–4.8)
Alkaline Phosphatase: 82 IU/L (ref 39–117)
BUN / CREAT RATIO: 11 (ref 10–24)
BUN: 22 mg/dL (ref 8–27)
Bilirubin Total: 0.6 mg/dL (ref 0.0–1.2)
CALCIUM: 9.5 mg/dL (ref 8.6–10.2)
CO2: 25 mmol/L (ref 20–29)
CREATININE: 1.97 mg/dL — AB (ref 0.76–1.27)
Chloride: 102 mmol/L (ref 96–106)
GFR calc Af Amer: 36 mL/min/{1.73_m2} — ABNORMAL LOW (ref >59)
GFR, EST NON AFRICAN AMERICAN: 31 mL/min/{1.73_m2} — AB (ref >59)
GLUCOSE: 91 mg/dL (ref 65–99)
Globulin, Total: 2.4 g/dL (ref 1.5–4.5)
POTASSIUM: 5.1 mmol/L (ref 3.5–5.2)
Sodium: 143 mmol/L (ref 134–144)
Total Protein: 6.6 g/dL (ref 6.0–8.5)

## 2016-12-18 NOTE — Telephone Encounter (Signed)
-----   Message from Larey Seat, MD sent at 12/18/2016 10:55 AM EDT ----- Decreased renal clearance- Creatinine 1.97 mg/dl, no anemia. Strongly recommend follow up with nephrologist. Cc Hulan Fess, MD

## 2016-12-18 NOTE — Telephone Encounter (Signed)
I sent this medication to a specialty pharmacy and can take 5-6 weeks before its processed.

## 2016-12-18 NOTE — Telephone Encounter (Signed)
Pt states the medication that Dohmeier called into pharmacy, the pharmacy does not stock it yet.  Pt was told to provide Dr Dohmeier  The# [phone] for the medication to be ordered through mail order, please call

## 2016-12-18 NOTE — Telephone Encounter (Signed)
Called patient to inform him of labwork. No answer at this time. LVM for patient to return call

## 2016-12-18 NOTE — Telephone Encounter (Signed)
Patient called office returning RN's call.  Please call °

## 2017-01-05 ENCOUNTER — Other Ambulatory Visit: Payer: Self-pay | Admitting: Adult Health

## 2017-01-14 NOTE — Telephone Encounter (Signed)
PA approved for the patient for the medication of Hetlioz 20 mg cap. Authorization is good for 01/15/2019.   [id number]

## 2017-03-26 DIAGNOSIS — Z125 Encounter for screening for malignant neoplasm of prostate: Secondary | ICD-10-CM | POA: Diagnosis not present

## 2017-03-26 DIAGNOSIS — H6123 Impacted cerumen, bilateral: Secondary | ICD-10-CM | POA: Diagnosis not present

## 2017-03-26 DIAGNOSIS — N183 Chronic kidney disease, stage 3 (moderate): Secondary | ICD-10-CM | POA: Diagnosis not present

## 2017-03-26 DIAGNOSIS — I10 Essential (primary) hypertension: Secondary | ICD-10-CM | POA: Diagnosis not present

## 2017-03-26 DIAGNOSIS — E782 Mixed hyperlipidemia: Secondary | ICD-10-CM | POA: Diagnosis not present

## 2017-03-26 DIAGNOSIS — Z23 Encounter for immunization: Secondary | ICD-10-CM | POA: Diagnosis not present

## 2017-03-26 DIAGNOSIS — F17200 Nicotine dependence, unspecified, uncomplicated: Secondary | ICD-10-CM | POA: Diagnosis not present

## 2017-03-26 DIAGNOSIS — F338 Other recurrent depressive disorders: Secondary | ICD-10-CM | POA: Diagnosis not present

## 2017-03-26 DIAGNOSIS — Z0000 Encounter for general adult medical examination without abnormal findings: Secondary | ICD-10-CM | POA: Diagnosis not present

## 2017-04-02 DIAGNOSIS — H1851 Endothelial corneal dystrophy: Secondary | ICD-10-CM | POA: Diagnosis not present

## 2017-04-02 DIAGNOSIS — H43813 Vitreous degeneration, bilateral: Secondary | ICD-10-CM | POA: Diagnosis not present

## 2017-04-02 DIAGNOSIS — H35373 Puckering of macula, bilateral: Secondary | ICD-10-CM | POA: Diagnosis not present

## 2017-04-02 DIAGNOSIS — H2513 Age-related nuclear cataract, bilateral: Secondary | ICD-10-CM | POA: Diagnosis not present

## 2017-04-02 DIAGNOSIS — H31092 Other chorioretinal scars, left eye: Secondary | ICD-10-CM | POA: Diagnosis not present

## 2017-04-24 ENCOUNTER — Ambulatory Visit: Payer: Medicare HMO | Admitting: Adult Health

## 2017-05-02 ENCOUNTER — Other Ambulatory Visit: Payer: Self-pay | Admitting: Neurology

## 2017-05-02 ENCOUNTER — Telehealth: Payer: Self-pay | Admitting: Neurology

## 2017-05-02 MED ORDER — ZOLPIDEM TARTRATE 10 MG PO TABS: 10.0000 mg | ORAL_TABLET | Freq: Every evening | ORAL | 3 refills | Status: DC | PRN

## 2017-05-02 NOTE — Telephone Encounter (Signed)
According to last OV noted from Dr. Brett Fairy, pt was advised not to use Ambien. Rx declined by me for that reason, please review.  Would not recommend Ambien given adv age.

## 2017-05-05 ENCOUNTER — Telehealth: Payer: Self-pay | Admitting: Neurology

## 2017-05-05 NOTE — Telephone Encounter (Signed)
Pa submitted for the [facility] for the patient. KEY: [facility] can take a few days to get a response.

## 2017-05-05 NOTE — Telephone Encounter (Signed)
PA approved through [place]. Good from 05/05/2017-05/05/2019.

## 2017-05-20 ENCOUNTER — Other Ambulatory Visit: Payer: Self-pay | Admitting: Family Medicine

## 2017-05-20 DIAGNOSIS — R9389 Abnormal findings on diagnostic imaging of other specified body structures: Secondary | ICD-10-CM

## 2017-05-26 ENCOUNTER — Ambulatory Visit
Admission: RE | Admit: 2017-05-26 | Discharge: 2017-05-26 | Disposition: A | Discharge status: Home / Self Care | Payer: Self-pay | Source: Ambulatory Visit | Attending: Family Medicine | Admitting: Family Medicine

## 2017-05-26 DIAGNOSIS — I6523 Occlusion and stenosis of bilateral carotid arteries: Secondary | ICD-10-CM | POA: Diagnosis not present

## 2017-05-26 DIAGNOSIS — R9389 Abnormal findings on diagnostic imaging of other specified body structures: Secondary | ICD-10-CM

## 2017-06-02 ENCOUNTER — Encounter: Payer: Self-pay | Admitting: Neurology

## 2017-06-02 ENCOUNTER — Ambulatory Visit: Payer: Medicare HMO | Admitting: Neurology

## 2017-06-02 VITALS — BP 117/75 | HR 76 | Ht 69.0 in | Wt 229.0 lb

## 2017-06-02 DIAGNOSIS — F5104 Psychophysiologic insomnia: Secondary | ICD-10-CM

## 2017-06-02 MED ORDER — ZOLPIDEM TARTRATE 10 MG PO TABS: 5.0000 mg | ORAL_TABLET | Freq: Every evening | ORAL | 1 refills | Status: DC | PRN

## 2017-06-02 NOTE — Patient Instructions (Signed)
Chronic insomnia -  Melatonin receptor 2 therapy was finally approved but due to costs patient declined.  I will not provide Ambien indefinitely. PCP will place a referral to cognitive behavior therapy.  We have used Noemi Chapel or  Dr. Casimiro Needle for the evaluation of chronic insomnia.  Please remember to try to maintain good sleep hygiene, which means: Keep a regular sleep and wake schedule, try not to exercise or have a meal within 2 hours of your bedtime, try to keep your bedroom conducive for sleep, that is, cool and dark, without light distractors such as an illuminated alarm clock, and refrain from watching TV right before sleep or in the middle of the night and do not keep the TV or radio on during the night. Also, try not to use or play on electronic devices at bedtime, such as your cell phone, tablet PC or laptop. If you like to read at bedtime on an electronic device, try to dim the background light as much as possible. Do not eat in the middle of the night.    For chronic insomnia, you are best followed by a psychiatrist and/or sleep psychologist.

## 2017-06-02 NOTE — Progress Notes (Signed)
PATIENT: Ethan Andrade DOB: [birth year]  REASON FOR VISIT: follow up- circadian rhythm disorder HISTORY FROM: patient   HISTORY OF PRESENT ILLNESS:  patient with a diagnosis of free running type circadian rhythm disorder- his insurance company finally approved the melatonin antagonist but Ethan Andrade felt he should \\nt  use a medication with that high price ticket ( 3000 USD) . He has not tried the medication, and this is the only approved for this condition.  FSS 9 points, Epworth 0 points. I have nothing else to offer.  He is retired, 82 years old and reports he feels great. He feels his sleep is improved- he now goes to bed between midnight and 1 AM- 2-3 hours earlier than he used to go. He has an action tremor, left and right. Bothers him when he tries to bring a fork to mouth.    This Caucasian 82 year old male patient presented with circadian rhythm disorder- thought to reflect non 24 hour disorder, free running type.  Can not sleep before 3-4 AM, and seems to sleep in daytime. Has tried sleep suppression Patient used Light box without success, he has used Ethan Andrade 10 mg 5 days a week but has also noticed that the efficacy has weaned he is not responding to Ethan Andrade every night he takes it. Actually,  more often sleeps still in daytime than at nighttime. He is not bothered to not go to sleep, but his wife is. His social life - and hers - are affected.  He is having trouble with sleep at night, not trouble to stay awake - cyclic good sleep.  Tremor not evident. Progressive kidney diease CKD stage 2 and  anemia, was seen at the nephrologist ? He reports he was told he had no concerning renal disease (?), Ethan Andrade kidney. Ethan. Erling Andrade. HTN , not diabetic.    Mr. Ethan Andrade is a 82 year old male with a history of circadian rhythm disorder. He returns today for follow-up. He is currently taking Ethan Andrade 10 mg at bedtime. He states he was instructed that he could only take the medication 5 times a week.  He states that when he takes medication 3 or 4 days in a row he gets approximately 8-10 hours of sleep and is able to establish a regular sleep routine. He states that the 2 days that he does not take the medication he is unable to sleep at all and for this reason it breaks his sleep routine. The patient would like to take Ethan Andrade 10 mg nightly. He reports some nights he does not get to  better before 2 AM. He states that if is able to use Ethan Andrade he thinks he would be able to go to bed earlier as he would be sleeping 8-10 hours a night. He denies any new neurological symptoms. He returns today for an evaluation.   HISTORY 06/27/16: Ethan Andrade is a 82 y.o. male , seen here as in a referral to our sleep clinic, from Ethan. Hulan Andrade , referral for Insomnia (and Tremor). Chief complaint according to patient : Trembling, and cannot go to sleep. Feeling fatigued.  Ethan Andrade carries a diagnosis of circadian rhythm sleep disorder, The manifestation of a circadian rhythm disorder is usually but the patient cannot sleep at night and sleeps best in the morning hours or daytime. This can be a form of insomnia. 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 This is an acquired  form of a circadian rhythm disorder. He has followed Ethan. Maxwell Andrade for the last 2-3 years. Ethan. Maxwell Andrade had initiated a regimen including Ethan Andrade as a sleep aid and SSRI antidepressants. Ethan. Maxwell Andrade had implemented a certain sleep routine advancing the bedtime gradually over a period of time. Before the patient saw Ethan. Maxwell Andrade in his bedtime could be as late as 4 AM. About 3 years ago they begun changing his bedtime with the help of the above medications, but the patient states that Ethan Andrade so and stopped working for him. In addition, he became ill and couldn't leave the bed for about 3 days, which resets his inner clock. He also struggles with each time change. Ethan Andrade does not want to  prescribe sleep aids. His wife has noted jerking movements and trembling in his sleep. The amplitude is strong enough to wake her up.    Sleep habits are as follows:Just recently the patient has been able to sleep only every other day but not at night. He goes at least 24 hours without sleep sometimes 36. He is known to sometimes not sleep t2-3 days in a row. This affects his social life of the family and marriage. Currently he goes to bed around 2 AM trying to go to sleep- but not always successfully. Sometimes he has by choice decided not to sleep in daytime and usually can then sleep some hours at night. He does not take daytime naps. If he goes to sleep at 2 AM he will stay asleep until his wife wakes him up at 12:00 or 13.00 hours. No snoring, No apnea.   Medical history and family history:  No family history of sleep disorders . Oldest sister takes a sleep aid.  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   Social history: married, former railroad Engineer, technical sales.  physically active shift job, retired at age 46. Used to sleep a lot in hotels, [employer], worker's cabins.  He has a very remote history of smoking and quit at age 63, is been a nonsmoker for 50+ years. He quit drinking the same year as he quit smoking.He does not drink caffeine native beverages of any kind.   REVIEW OF SYSTEMS: Out of a complete 14 system review of symptoms, the patient complains only of the following symptoms, and all other reviewed systems are negative. Fatigue severity score 11,  Epworth sleepiness score 0 Insomnia has improved , back pain, tremors with action, not at rest.   ALLERGIES: No Known Allergies  HOME MEDICATIONS: Outpatient Medications Prior to Visit  Medication Sig Dispense Refill  . aspirin 81 MG chewable tablet Chew 81 mg by mouth daily.    . cholecalciferol (VITAMIN D) 1000 units tablet Take by mouth.    . escitalopram (LEXAPRO) 10 MG tablet     . fish oil-omega-3 fatty acids 1000  MG capsule Take 2 g by mouth 2 (two) times daily.    . hydrochlorothiazide (HYDRODIURIL) 12.5 MG tablet Take 25 mg by mouth daily.    Ethan Andrade (PRINIVIL,ZESTRIL) 10 MG tablet TK 1 T PO QD  1  . Multiple Vitamins-Minerals (CENTRUM SILVER PO) Take 1 tablet by mouth daily.    . saw palmetto 500 MG capsule Take 500 mg by mouth daily.    . vitamin C (ASCORBIC ACID) 500 MG tablet Take 500 mg by mouth daily.    Ethan Kitchen zolpidem (Ethan Andrade) 10 MG tablet Take 1 tablet (10 mg total) by mouth at bedtime as needed for sleep. 30 tablet 3  .  Tasimelteon (HETLIOZ) 20 MG CAPS 20 mg at bedtime by mouth 30 capsule 5   No facility-administered medications prior to visit.     PAST MEDICAL HISTORY: Past Medical History:  Diagnosis Date  . Epididymitis   . Renal disorder    creatinine levels are high    PAST SURGICAL HISTORY: Past Surgical History:  Procedure Laterality Date  . CHOLECYSTECTOMY    . ELBOW SURGERY    . HEMORROIDECTOMY    . VASECTOMY      FAMILY HISTORY: Family History  Problem Relation Age of Onset  . Other Brother        prostate cancer  . Other Brother        prostate cancer    SOCIAL HISTORY: Social History   Socioeconomic History  . Marital status: Married    Spouse name: Not on file  . Number of children: 2  . Years of education: HS  . Highest education level: Not on file  Social Needs  . Financial resource strain: Not on file  . Food insecurity - worry: Not on file  . Food insecurity - inability: Not on file  . Transportation needs - medical: Not on file  . Transportation needs - non-medical: Not on file  Occupational History  . Occupation: Retired  Tobacco Use  . Smoking status: Former Research scientist (life sciences)  . Smokeless tobacco: Current User    Types: Chew  Substance and Sexual Activity  . Alcohol use: Yes    Comment: Rare  . Drug use: No  . Sexual activity: No  Other Topics Concern  . Not on file  Social History Narrative   Caffeine use: rare       PHYSICAL  EXAM  Vitals:   06/02/17 1532  BP: 117/75  Pulse: 76  Weight: 229 lb (103.9 kg)  Height: 5\' 9"  (1.753 m)   Body mass index is 33.82 kg/m.  Generalized: Well developed, in no acute distress . Well groomed.   Neurological examination  General: The patient is awake, alert and appears not in acute distress.  Head: Normocephalic, atraumatic. Neck is supple. Mallampati 4, neck circumference:18.5  Cardiovascular:  Regular rate and rhythm. Respiratory: Lungs are clear to auscultation. Skin:  With rash on both hands , over the knuckles. Not psoriatic .  Trunk: BMI is 34 ,elevated with normal posture.   Neurologic exam : The patient is awake and alert, oriented to place and time. Mood and affect are appropriate.  Cranial nerves:  sense of taste has changed- smell is intact. Pupils are equal and briskly reactive to light.Extraocular movements  in vertical and horizontal planes intact and without nystagmus. Visual fields by finger perimetry are intact. Hearing to finger rub intact.  Facial sensation intact to fine touch. Facial motor strength is symmetric and tongue and uvula move midline. Motor exam:   Normal tone and normal muscle bulk and symmetric normal strength in all extremities. Sensory:  Fine touch, pinprick and vibration were tested in all extremities. Proprioception is tested in the upper extremities only. This was  normal. Coordination: Rapid alternating movements in the fingers/hands is tested and normal.  Finger-to-nose maneuver  without evidence of ataxia, dysmetria and only mild tremor-. Gait and station: Patient walks without assistive device .    DIAGNOSTIC DATA (LABS, IMAGING, TESTING) - I reviewed patient records, labs, notes, testing and imaging myself where available.  Lab Results  Component Value Date   WBC 10.0 12/17/2016   HGB 15.0 12/17/2016   HCT 44.6 12/17/2016  MCV 98 (H) 12/17/2016   PLT 312 12/17/2016      ASSESSMENT AND PLAN:   1. Circadian  rhythm disorder, non 24 hour - advanced bed time to midnight- a great success.  . 2. CKD grade 1 or 2 per patients report.  3  CMET and CBC before trial of HETLIOZ. Hetlioz was approved finally, and patient declined. He expects to use Ethan Andrade indefinitely.   I discussed the patient's medication regimen .  I explained to the patient that Ethan Andrade can be habit forming.  There is also a risk for parasomnias on this medication including sleepwalking, participating in activities and having no recollection of those activities. For example driving,  sexual activity and talking on the phone. Patient verbalized understanding. He reports that he does understand the risk associated with this medication.   I spent 15 minutes with the patient.  50% face to face , he will follow in prn  In 12  month with NP.   Larey Seat, MD   06/02/2017, 4:05 PM [facility] [address] [place], [place] [address] [phone]

## 2017-09-03 DIAGNOSIS — N183 Chronic kidney disease, stage 3 (moderate): Secondary | ICD-10-CM | POA: Diagnosis not present

## 2017-09-05 ENCOUNTER — Other Ambulatory Visit: Payer: Self-pay | Admitting: Neurology

## 2017-09-08 ENCOUNTER — Other Ambulatory Visit: Payer: Self-pay | Admitting: Neurology

## 2018-03-03 ENCOUNTER — Other Ambulatory Visit: Payer: Self-pay | Admitting: Neurology

## 2018-04-08 DIAGNOSIS — E669 Obesity, unspecified: Secondary | ICD-10-CM | POA: Diagnosis not present

## 2018-04-08 DIAGNOSIS — I129 Hypertensive chronic kidney disease with stage 1 through stage 4 chronic kidney disease, or unspecified chronic kidney disease: Secondary | ICD-10-CM | POA: Diagnosis not present

## 2018-04-08 DIAGNOSIS — G4700 Insomnia, unspecified: Secondary | ICD-10-CM | POA: Diagnosis not present

## 2018-04-08 DIAGNOSIS — Z0000 Encounter for general adult medical examination without abnormal findings: Secondary | ICD-10-CM | POA: Diagnosis not present

## 2018-04-08 DIAGNOSIS — Z6833 Body mass index (BMI) 33.0-33.9, adult: Secondary | ICD-10-CM | POA: Diagnosis not present

## 2018-04-08 DIAGNOSIS — Z79899 Other long term (current) drug therapy: Secondary | ICD-10-CM | POA: Diagnosis not present

## 2018-04-08 DIAGNOSIS — N189 Chronic kidney disease, unspecified: Secondary | ICD-10-CM | POA: Diagnosis not present

## 2018-04-08 DIAGNOSIS — E782 Mixed hyperlipidemia: Secondary | ICD-10-CM | POA: Diagnosis not present

## 2018-07-22 ENCOUNTER — Other Ambulatory Visit: Payer: Self-pay | Admitting: Neurology

## 2018-07-22 MED ORDER — ZOLPIDEM TARTRATE 10 MG PO TABS: 10.0000 mg | ORAL_TABLET | Freq: Every day | ORAL | 0 refills | Status: AC

## 2018-07-29 DIAGNOSIS — I129 Hypertensive chronic kidney disease with stage 1 through stage 4 chronic kidney disease, or unspecified chronic kidney disease: Secondary | ICD-10-CM | POA: Diagnosis not present

## 2018-07-29 DIAGNOSIS — N183 Chronic kidney disease, stage 3 (moderate): Secondary | ICD-10-CM | POA: Diagnosis not present

## 2018-07-29 DIAGNOSIS — G4700 Insomnia, unspecified: Secondary | ICD-10-CM | POA: Diagnosis not present

## 2018-08-14 DIAGNOSIS — H18453 Nodular corneal degeneration, bilateral: Secondary | ICD-10-CM | POA: Diagnosis not present

## 2018-08-14 DIAGNOSIS — H35341 Macular cyst, hole, or pseudohole, right eye: Secondary | ICD-10-CM | POA: Diagnosis not present

## 2018-08-14 DIAGNOSIS — H5310 Unspecified subjective visual disturbances: Secondary | ICD-10-CM | POA: Diagnosis not present

## 2018-08-14 DIAGNOSIS — H2513 Age-related nuclear cataract, bilateral: Secondary | ICD-10-CM | POA: Diagnosis not present

## 2018-08-21 DIAGNOSIS — G4700 Insomnia, unspecified: Secondary | ICD-10-CM | POA: Diagnosis not present

## 2018-09-01 DIAGNOSIS — N183 Chronic kidney disease, stage 3 (moderate): Secondary | ICD-10-CM | POA: Diagnosis not present

## 2018-09-01 DIAGNOSIS — N189 Chronic kidney disease, unspecified: Secondary | ICD-10-CM | POA: Diagnosis not present

## 2018-09-11 DIAGNOSIS — F5101 Primary insomnia: Secondary | ICD-10-CM | POA: Diagnosis not present

## 2018-09-11 DIAGNOSIS — E8809 Other disorders of plasma-protein metabolism, not elsewhere classified: Secondary | ICD-10-CM | POA: Diagnosis not present

## 2018-09-11 DIAGNOSIS — N183 Chronic kidney disease, stage 3 (moderate): Secondary | ICD-10-CM | POA: Diagnosis not present

## 2018-09-18 DIAGNOSIS — B351 Tinea unguium: Secondary | ICD-10-CM | POA: Diagnosis not present

## 2018-09-18 DIAGNOSIS — G4700 Insomnia, unspecified: Secondary | ICD-10-CM | POA: Diagnosis not present

## 2018-10-01 DIAGNOSIS — B351 Tinea unguium: Secondary | ICD-10-CM | POA: Diagnosis not present

## 2018-10-01 DIAGNOSIS — B353 Tinea pedis: Secondary | ICD-10-CM | POA: Diagnosis not present

## 2018-10-02 DIAGNOSIS — I10 Essential (primary) hypertension: Secondary | ICD-10-CM | POA: Diagnosis not present

## 2018-10-02 DIAGNOSIS — G4700 Insomnia, unspecified: Secondary | ICD-10-CM | POA: Diagnosis not present

## 2018-10-02 DIAGNOSIS — N183 Chronic kidney disease, stage 3 (moderate): Secondary | ICD-10-CM | POA: Diagnosis not present

## 2018-10-30 DIAGNOSIS — G4700 Insomnia, unspecified: Secondary | ICD-10-CM | POA: Diagnosis not present

## 2018-10-30 DIAGNOSIS — N183 Chronic kidney disease, stage 3 (moderate): Secondary | ICD-10-CM | POA: Diagnosis not present

## 2018-10-30 DIAGNOSIS — I129 Hypertensive chronic kidney disease with stage 1 through stage 4 chronic kidney disease, or unspecified chronic kidney disease: Secondary | ICD-10-CM | POA: Diagnosis not present

## 2018-12-07 DIAGNOSIS — F329 Major depressive disorder, single episode, unspecified: Secondary | ICD-10-CM | POA: Diagnosis not present

## 2018-12-07 DIAGNOSIS — G3184 Mild cognitive impairment, so stated: Secondary | ICD-10-CM | POA: Diagnosis not present

## 2018-12-07 DIAGNOSIS — G4700 Insomnia, unspecified: Secondary | ICD-10-CM | POA: Diagnosis not present

## 2019-01-12 DIAGNOSIS — I129 Hypertensive chronic kidney disease with stage 1 through stage 4 chronic kidney disease, or unspecified chronic kidney disease: Secondary | ICD-10-CM | POA: Diagnosis not present

## 2019-01-12 DIAGNOSIS — N183 Chronic kidney disease, stage 3 unspecified: Secondary | ICD-10-CM | POA: Diagnosis not present

## 2019-01-12 DIAGNOSIS — N400 Enlarged prostate without lower urinary tract symptoms: Secondary | ICD-10-CM | POA: Diagnosis not present

## 2019-01-12 DIAGNOSIS — G4700 Insomnia, unspecified: Secondary | ICD-10-CM | POA: Diagnosis not present

## 2019-01-19 DIAGNOSIS — G4700 Insomnia, unspecified: Secondary | ICD-10-CM | POA: Diagnosis not present

## 2019-05-11 DIAGNOSIS — N183 Chronic kidney disease, stage 3 unspecified: Secondary | ICD-10-CM | POA: Diagnosis not present

## 2019-05-11 DIAGNOSIS — E785 Hyperlipidemia, unspecified: Secondary | ICD-10-CM | POA: Diagnosis not present

## 2019-05-11 DIAGNOSIS — I10 Essential (primary) hypertension: Secondary | ICD-10-CM | POA: Diagnosis not present

## 2019-05-14 DIAGNOSIS — N400 Enlarged prostate without lower urinary tract symptoms: Secondary | ICD-10-CM | POA: Diagnosis not present

## 2019-05-14 DIAGNOSIS — I129 Hypertensive chronic kidney disease with stage 1 through stage 4 chronic kidney disease, or unspecified chronic kidney disease: Secondary | ICD-10-CM | POA: Diagnosis not present

## 2019-05-14 DIAGNOSIS — E785 Hyperlipidemia, unspecified: Secondary | ICD-10-CM | POA: Diagnosis not present

## 2019-05-14 DIAGNOSIS — Z1331 Encounter for screening for depression: Secondary | ICD-10-CM | POA: Diagnosis not present

## 2019-05-14 DIAGNOSIS — G250 Essential tremor: Secondary | ICD-10-CM | POA: Diagnosis not present

## 2019-05-14 DIAGNOSIS — G4700 Insomnia, unspecified: Secondary | ICD-10-CM | POA: Diagnosis not present

## 2019-05-14 DIAGNOSIS — N183 Chronic kidney disease, stage 3 unspecified: Secondary | ICD-10-CM | POA: Diagnosis not present

## 2019-06-21 IMAGING — US US CAROTID DUPLEX BILAT
1 series · 13 of 24 positions shown · non-contrast
Comparison: None.

CLINICAL DATA: Abnormal screening examination.

EXAM:
BILATERAL CAROTID DUPLEX ULTRASOUND
TECHNIQUE: Gray scale imaging, color Doppler and duplex ultrasound were
performed of bilateral carotid and vertebral arteries in the neck.

[Series 1: us carotid duplex bilat · 0.06mm/px · 13 of 43 slices shown]
[im 1/43]
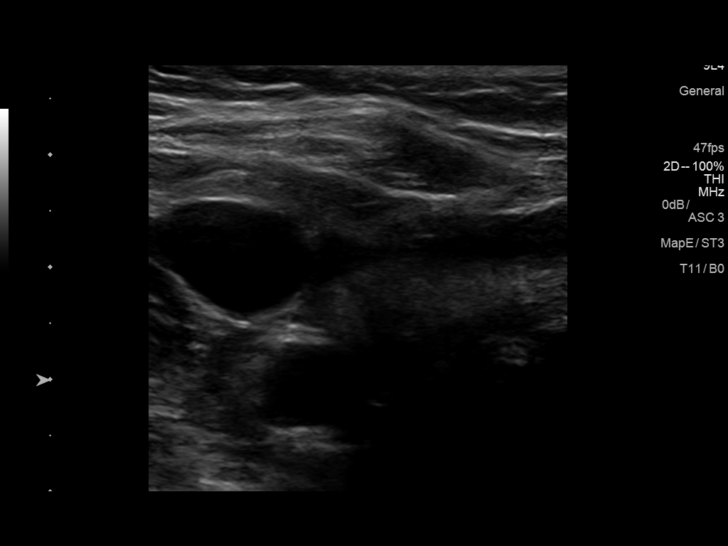
[im 4/43]
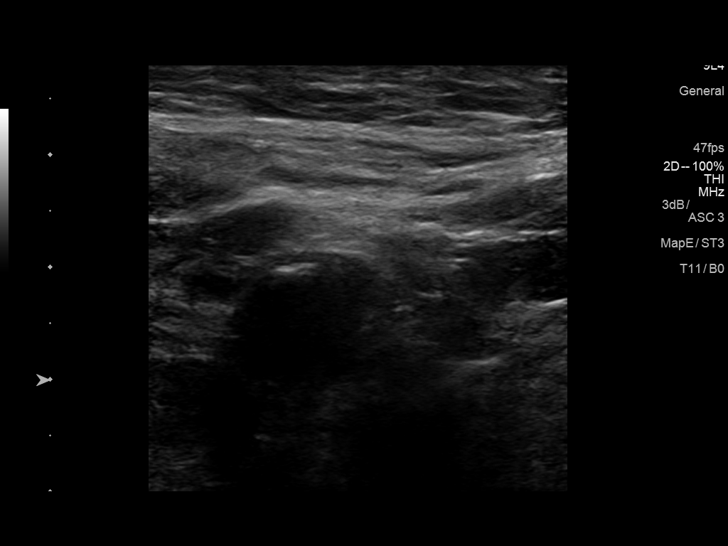
[im 8/43]
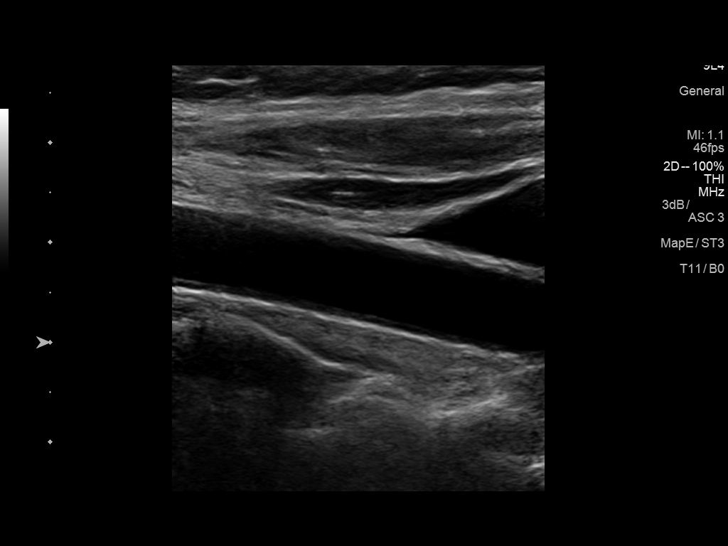
[im 11/43]
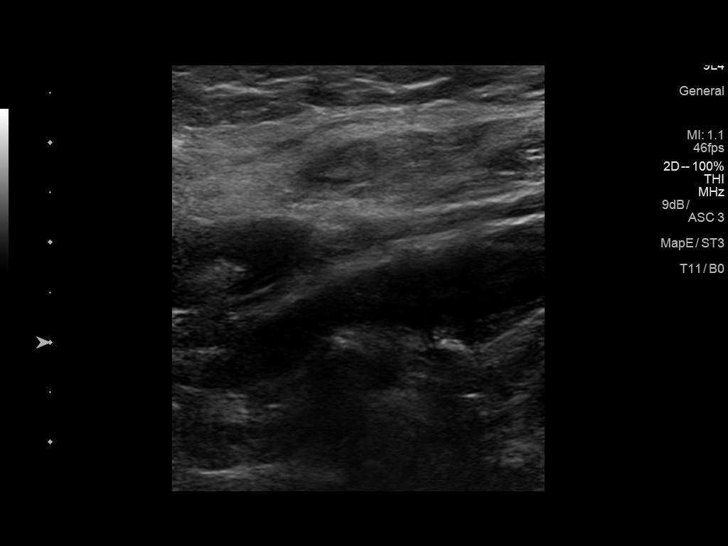
[im 15/43]
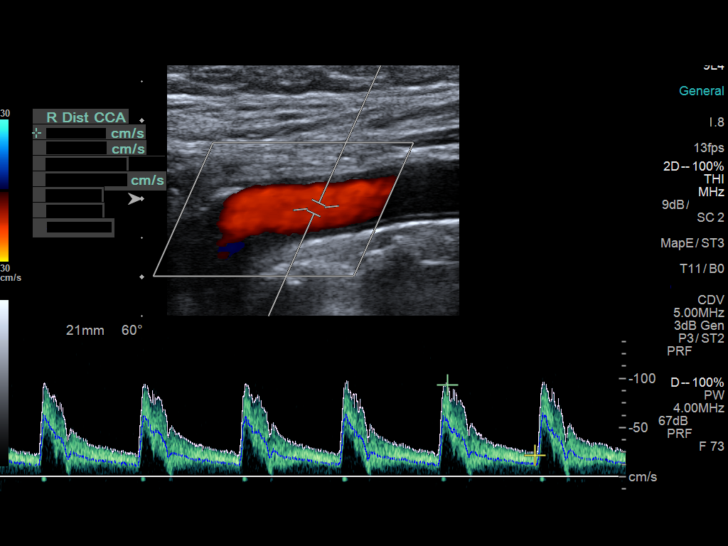
[im 19/43]
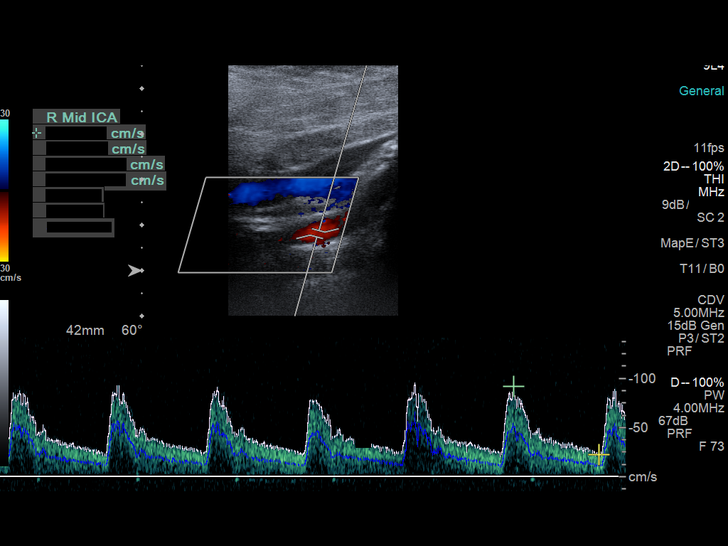
[im 22/43]
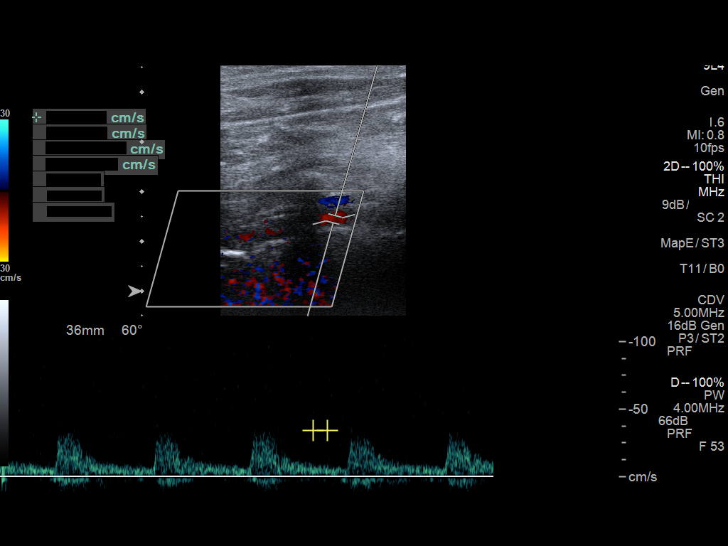
[im 24/43]
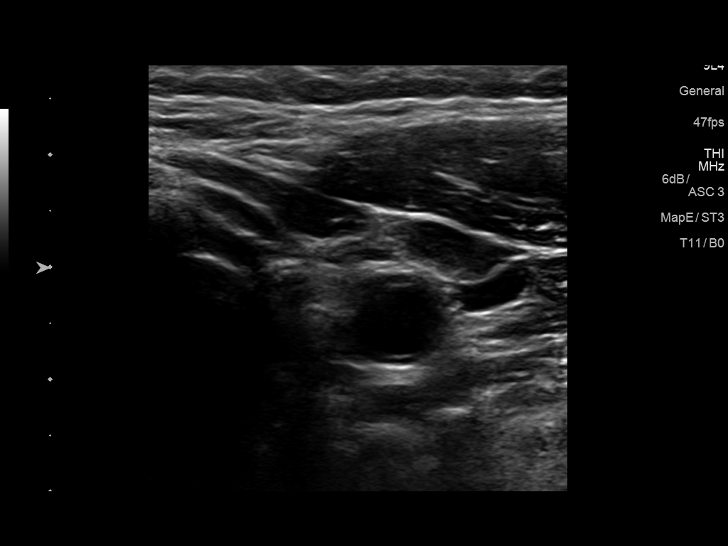
[im 28/43]
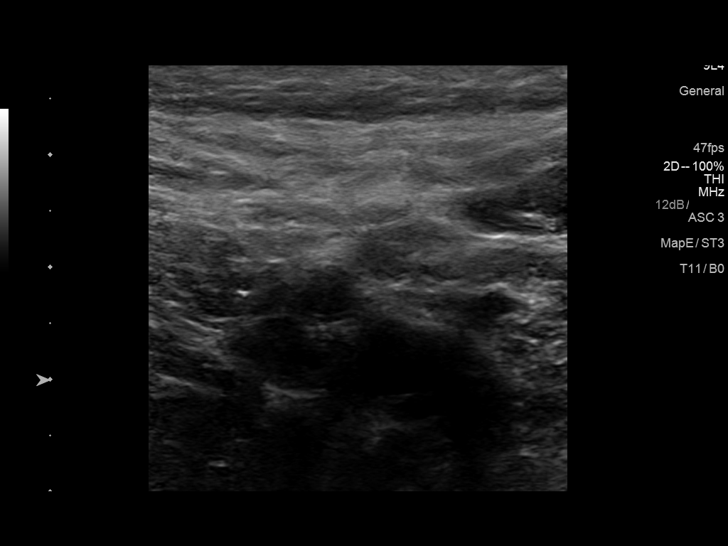
[im 32/43]
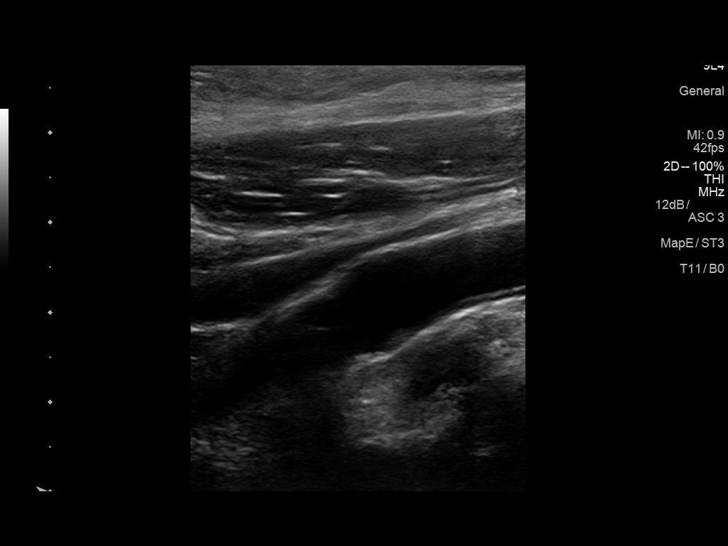
[im 35/43]
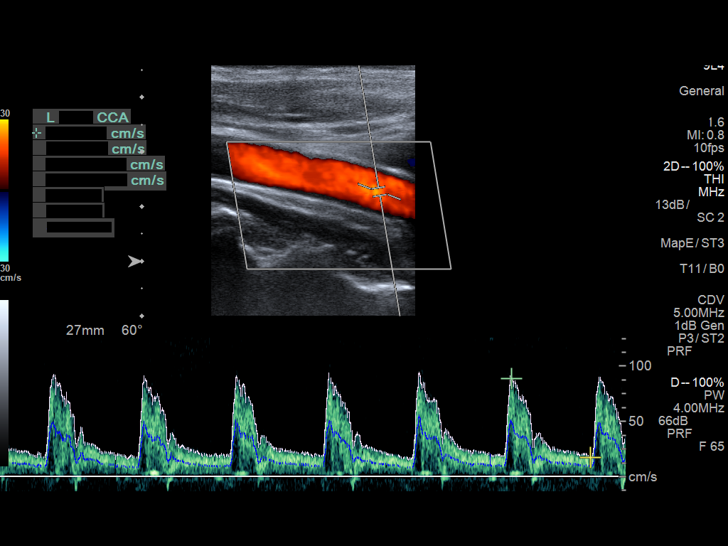
[im 39/43]
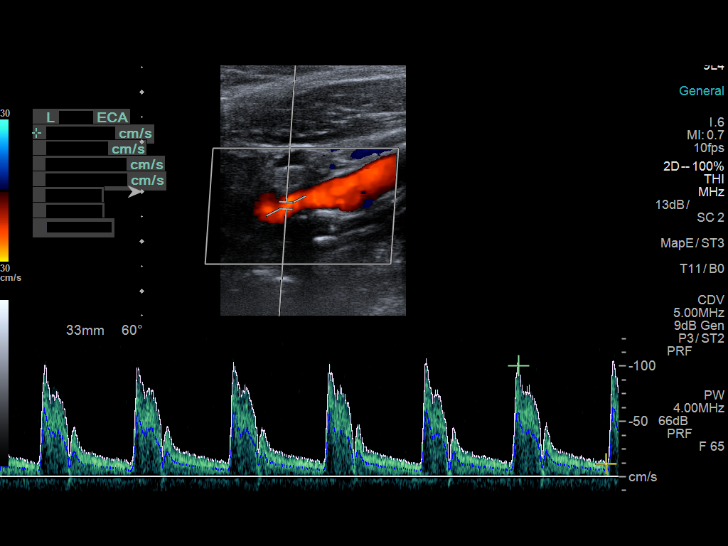
[im 43/43]
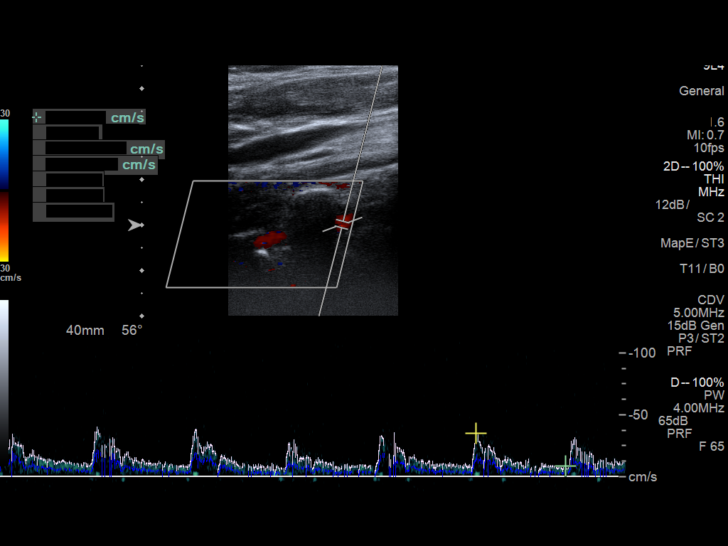

[13 of 24 positions shown; findings below may reference images not displayed]

FINDINGS: Criteria: Quantification of carotid stenosis is based on velocity
parameters that correlate the residual internal carotid diameter
with NASCET-based stenosis levels, using the diameter of the distal
internal carotid lumen as the denominator for stenosis measurement.

The following velocity measurements were obtained:

RIGHT

ICA:  92 cm/sec

CCA:  98 cm/sec

SYSTOLIC ICA/CCA RATIO:

DIASTOLIC ICA/CCA RATIO:

ECA:  106 cm/sec

LEFT

ICA:  97 cm/sec

CCA:  89 cm/sec

SYSTOLIC ICA/CCA RATIO:

DIASTOLIC ICA/CCA RATIO:

ECA:  100 cm/sec

RIGHT CAROTID ARTERY: Small amount of plaque at the right carotid
bulb. Small amount of echogenic plaque in the proximal internal
carotid artery. External carotid artery is patent with normal
waveform. Normal waveforms and velocities in the internal carotid
artery.

RIGHT VERTEBRAL ARTERY: Antegrade flow and normal waveform in the
right vertebral artery.

LEFT CAROTID ARTERY: Minimal plaque or intimal thickening at the
left carotid bulb. External carotid artery is patent with normal
waveform. Normal waveforms and velocities in the internal carotid
artery.

LEFT VERTEBRAL ARTERY: Antegrade flow and normal waveform in the
left vertebral artery.
IMPRESSION: Mild atherosclerotic disease in the carotid arteries. Estimated
degree of stenosis in the internal carotid arteries is less than 50%
bilaterally.

Patent vertebral arteries with antegrade flow.

## 2019-07-12 DIAGNOSIS — N183 Chronic kidney disease, stage 3 unspecified: Secondary | ICD-10-CM | POA: Diagnosis not present

## 2019-08-17 DIAGNOSIS — G4700 Insomnia, unspecified: Secondary | ICD-10-CM | POA: Diagnosis not present

## 2019-08-19 DIAGNOSIS — Z79899 Other long term (current) drug therapy: Secondary | ICD-10-CM | POA: Diagnosis not present

## 2019-08-20 DIAGNOSIS — E8770 Fluid overload, unspecified: Secondary | ICD-10-CM | POA: Diagnosis not present

## 2019-08-20 DIAGNOSIS — I129 Hypertensive chronic kidney disease with stage 1 through stage 4 chronic kidney disease, or unspecified chronic kidney disease: Secondary | ICD-10-CM | POA: Diagnosis not present

## 2019-08-20 DIAGNOSIS — N183 Chronic kidney disease, stage 3 unspecified: Secondary | ICD-10-CM | POA: Diagnosis not present

## 2019-08-20 DIAGNOSIS — N2581 Secondary hyperparathyroidism of renal origin: Secondary | ICD-10-CM | POA: Diagnosis not present

## 2019-08-25 DIAGNOSIS — H35373 Puckering of macula, bilateral: Secondary | ICD-10-CM | POA: Diagnosis not present

## 2019-08-25 DIAGNOSIS — H18523 Epithelial (juvenile) corneal dystrophy, bilateral: Secondary | ICD-10-CM | POA: Diagnosis not present

## 2019-08-25 DIAGNOSIS — H2513 Age-related nuclear cataract, bilateral: Secondary | ICD-10-CM | POA: Diagnosis not present

## 2019-08-25 DIAGNOSIS — H18453 Nodular corneal degeneration, bilateral: Secondary | ICD-10-CM | POA: Diagnosis not present

## 2019-09-29 DIAGNOSIS — H2512 Age-related nuclear cataract, left eye: Secondary | ICD-10-CM | POA: Diagnosis not present

## 2019-10-07 DIAGNOSIS — H259 Unspecified age-related cataract: Secondary | ICD-10-CM | POA: Diagnosis not present

## 2019-10-07 DIAGNOSIS — H2512 Age-related nuclear cataract, left eye: Secondary | ICD-10-CM | POA: Diagnosis not present

## 2019-10-07 DIAGNOSIS — H5703 Miosis: Secondary | ICD-10-CM | POA: Diagnosis not present

## 2019-10-08 DIAGNOSIS — H2511 Age-related nuclear cataract, right eye: Secondary | ICD-10-CM | POA: Diagnosis not present

## 2019-10-21 DIAGNOSIS — H259 Unspecified age-related cataract: Secondary | ICD-10-CM | POA: Diagnosis not present

## 2019-10-21 DIAGNOSIS — H2511 Age-related nuclear cataract, right eye: Secondary | ICD-10-CM | POA: Diagnosis not present

## 2019-10-30 DIAGNOSIS — R1011 Right upper quadrant pain: Secondary | ICD-10-CM | POA: Diagnosis not present

## 2019-10-30 DIAGNOSIS — R0789 Other chest pain: Secondary | ICD-10-CM | POA: Diagnosis not present

## 2019-10-31 DIAGNOSIS — N183 Chronic kidney disease, stage 3 unspecified: Secondary | ICD-10-CM | POA: Diagnosis not present

## 2019-10-31 DIAGNOSIS — J90 Pleural effusion, not elsewhere classified: Secondary | ICD-10-CM | POA: Diagnosis not present

## 2019-10-31 DIAGNOSIS — E860 Dehydration: Secondary | ICD-10-CM | POA: Diagnosis not present

## 2019-10-31 DIAGNOSIS — J189 Pneumonia, unspecified organism: Secondary | ICD-10-CM | POA: Diagnosis not present

## 2019-10-31 DIAGNOSIS — R079 Chest pain, unspecified: Secondary | ICD-10-CM | POA: Diagnosis not present

## 2019-10-31 DIAGNOSIS — Z87891 Personal history of nicotine dependence: Secondary | ICD-10-CM | POA: Diagnosis not present

## 2019-10-31 DIAGNOSIS — N179 Acute kidney failure, unspecified: Secondary | ICD-10-CM | POA: Diagnosis not present

## 2019-10-31 DIAGNOSIS — Z20828 Contact with and (suspected) exposure to other viral communicable diseases: Secondary | ICD-10-CM | POA: Diagnosis not present

## 2019-10-31 DIAGNOSIS — D72829 Elevated white blood cell count, unspecified: Secondary | ICD-10-CM | POA: Diagnosis not present

## 2019-10-31 DIAGNOSIS — A419 Sepsis, unspecified organism: Secondary | ICD-10-CM | POA: Diagnosis not present

## 2019-10-31 DIAGNOSIS — Z66 Do not resuscitate: Secondary | ICD-10-CM | POA: Diagnosis not present

## 2019-10-31 DIAGNOSIS — R918 Other nonspecific abnormal finding of lung field: Secondary | ICD-10-CM | POA: Diagnosis not present

## 2019-10-31 DIAGNOSIS — Z20822 Contact with and (suspected) exposure to covid-19: Secondary | ICD-10-CM | POA: Diagnosis not present

## 2019-10-31 DIAGNOSIS — E871 Hypo-osmolality and hyponatremia: Secondary | ICD-10-CM | POA: Diagnosis not present

## 2019-10-31 DIAGNOSIS — E8809 Other disorders of plasma-protein metabolism, not elsewhere classified: Secondary | ICD-10-CM | POA: Diagnosis not present

## 2019-11-11 DIAGNOSIS — D72829 Elevated white blood cell count, unspecified: Secondary | ICD-10-CM | POA: Diagnosis not present

## 2019-11-11 DIAGNOSIS — I129 Hypertensive chronic kidney disease with stage 1 through stage 4 chronic kidney disease, or unspecified chronic kidney disease: Secondary | ICD-10-CM | POA: Diagnosis not present

## 2019-11-11 DIAGNOSIS — Z1389 Encounter for screening for other disorder: Secondary | ICD-10-CM | POA: Diagnosis not present

## 2019-11-11 DIAGNOSIS — Z0000 Encounter for general adult medical examination without abnormal findings: Secondary | ICD-10-CM | POA: Diagnosis not present

## 2019-11-11 DIAGNOSIS — N183 Chronic kidney disease, stage 3 unspecified: Secondary | ICD-10-CM | POA: Diagnosis not present

## 2019-11-11 DIAGNOSIS — J189 Pneumonia, unspecified organism: Secondary | ICD-10-CM | POA: Diagnosis not present

## 2019-11-11 DIAGNOSIS — G4700 Insomnia, unspecified: Secondary | ICD-10-CM | POA: Diagnosis not present

## 2019-11-27 DIAGNOSIS — R0781 Pleurodynia: Secondary | ICD-10-CM | POA: Diagnosis not present

## 2019-12-06 DIAGNOSIS — G4700 Insomnia, unspecified: Secondary | ICD-10-CM | POA: Diagnosis not present

## 2019-12-06 DIAGNOSIS — J189 Pneumonia, unspecified organism: Secondary | ICD-10-CM | POA: Diagnosis not present

## 2019-12-06 DIAGNOSIS — J90 Pleural effusion, not elsewhere classified: Secondary | ICD-10-CM | POA: Diagnosis not present

## 2019-12-27 DIAGNOSIS — Z8701 Personal history of pneumonia (recurrent): Secondary | ICD-10-CM | POA: Diagnosis not present

## 2019-12-27 DIAGNOSIS — R1010 Upper abdominal pain, unspecified: Secondary | ICD-10-CM | POA: Diagnosis not present

## 2019-12-27 DIAGNOSIS — R0602 Shortness of breath: Secondary | ICD-10-CM | POA: Diagnosis not present

## 2019-12-27 DIAGNOSIS — R0781 Pleurodynia: Secondary | ICD-10-CM | POA: Diagnosis not present

## 2020-01-06 DIAGNOSIS — N184 Chronic kidney disease, stage 4 (severe): Secondary | ICD-10-CM | POA: Diagnosis not present

## 2020-01-06 DIAGNOSIS — Z6830 Body mass index (BMI) 30.0-30.9, adult: Secondary | ICD-10-CM | POA: Diagnosis not present

## 2020-01-06 DIAGNOSIS — F325 Major depressive disorder, single episode, in full remission: Secondary | ICD-10-CM | POA: Diagnosis not present

## 2020-01-06 DIAGNOSIS — G4700 Insomnia, unspecified: Secondary | ICD-10-CM | POA: Diagnosis not present

## 2020-01-06 DIAGNOSIS — Z79899 Other long term (current) drug therapy: Secondary | ICD-10-CM | POA: Diagnosis not present

## 2020-01-06 DIAGNOSIS — R1012 Left upper quadrant pain: Secondary | ICD-10-CM | POA: Diagnosis not present

## 2020-01-06 DIAGNOSIS — I129 Hypertensive chronic kidney disease with stage 1 through stage 4 chronic kidney disease, or unspecified chronic kidney disease: Secondary | ICD-10-CM | POA: Diagnosis not present

## 2020-01-06 DIAGNOSIS — E669 Obesity, unspecified: Secondary | ICD-10-CM | POA: Diagnosis not present

## 2020-01-06 DIAGNOSIS — Z23 Encounter for immunization: Secondary | ICD-10-CM | POA: Diagnosis not present

## 2020-01-10 DIAGNOSIS — K4090 Unilateral inguinal hernia, without obstruction or gangrene, not specified as recurrent: Secondary | ICD-10-CM | POA: Diagnosis not present

## 2020-01-10 DIAGNOSIS — K5730 Diverticulosis of large intestine without perforation or abscess without bleeding: Secondary | ICD-10-CM | POA: Diagnosis not present

## 2020-01-10 DIAGNOSIS — R9341 Abnormal radiologic findings on diagnostic imaging of renal pelvis, ureter, or bladder: Secondary | ICD-10-CM | POA: Diagnosis not present

## 2020-01-10 DIAGNOSIS — N200 Calculus of kidney: Secondary | ICD-10-CM | POA: Diagnosis not present

## 2020-01-11 DIAGNOSIS — E785 Hyperlipidemia, unspecified: Secondary | ICD-10-CM | POA: Diagnosis not present

## 2020-01-11 DIAGNOSIS — K5730 Diverticulosis of large intestine without perforation or abscess without bleeding: Secondary | ICD-10-CM | POA: Diagnosis not present

## 2020-01-11 DIAGNOSIS — R911 Solitary pulmonary nodule: Secondary | ICD-10-CM | POA: Diagnosis not present

## 2020-01-11 DIAGNOSIS — I10 Essential (primary) hypertension: Secondary | ICD-10-CM | POA: Diagnosis not present

## 2020-01-11 DIAGNOSIS — E663 Overweight: Secondary | ICD-10-CM | POA: Diagnosis not present

## 2020-01-11 DIAGNOSIS — Z6829 Body mass index (BMI) 29.0-29.9, adult: Secondary | ICD-10-CM | POA: Diagnosis not present

## 2020-01-11 DIAGNOSIS — I700 Atherosclerosis of aorta: Secondary | ICD-10-CM | POA: Diagnosis not present

## 2020-01-11 DIAGNOSIS — Z87442 Personal history of urinary calculi: Secondary | ICD-10-CM | POA: Diagnosis not present

## 2020-03-01 DIAGNOSIS — I10 Essential (primary) hypertension: Secondary | ICD-10-CM | POA: Diagnosis not present

## 2020-03-01 DIAGNOSIS — N183 Chronic kidney disease, stage 3 unspecified: Secondary | ICD-10-CM | POA: Diagnosis not present

## 2020-03-02 DIAGNOSIS — N2581 Secondary hyperparathyroidism of renal origin: Secondary | ICD-10-CM | POA: Diagnosis not present

## 2020-03-02 DIAGNOSIS — D631 Anemia in chronic kidney disease: Secondary | ICD-10-CM | POA: Diagnosis not present

## 2020-03-02 DIAGNOSIS — N183 Chronic kidney disease, stage 3 unspecified: Secondary | ICD-10-CM | POA: Diagnosis not present

## 2020-03-02 DIAGNOSIS — I129 Hypertensive chronic kidney disease with stage 1 through stage 4 chronic kidney disease, or unspecified chronic kidney disease: Secondary | ICD-10-CM | POA: Diagnosis not present

## 2020-03-03 DIAGNOSIS — Z20822 Contact with and (suspected) exposure to covid-19: Secondary | ICD-10-CM | POA: Diagnosis not present

## 2020-03-03 DIAGNOSIS — R509 Fever, unspecified: Secondary | ICD-10-CM | POA: Diagnosis not present

## 2020-05-09 DIAGNOSIS — E785 Hyperlipidemia, unspecified: Secondary | ICD-10-CM | POA: Diagnosis not present

## 2020-05-09 DIAGNOSIS — I10 Essential (primary) hypertension: Secondary | ICD-10-CM | POA: Diagnosis not present

## 2020-05-09 DIAGNOSIS — N400 Enlarged prostate without lower urinary tract symptoms: Secondary | ICD-10-CM | POA: Diagnosis not present

## 2020-05-16 DIAGNOSIS — Z1331 Encounter for screening for depression: Secondary | ICD-10-CM | POA: Diagnosis not present

## 2020-05-16 DIAGNOSIS — E785 Hyperlipidemia, unspecified: Secondary | ICD-10-CM | POA: Diagnosis not present

## 2020-05-16 DIAGNOSIS — G4700 Insomnia, unspecified: Secondary | ICD-10-CM | POA: Diagnosis not present

## 2020-05-16 DIAGNOSIS — Z79899 Other long term (current) drug therapy: Secondary | ICD-10-CM | POA: Diagnosis not present

## 2020-05-16 DIAGNOSIS — F325 Major depressive disorder, single episode, in full remission: Secondary | ICD-10-CM | POA: Diagnosis not present

## 2020-05-16 DIAGNOSIS — Z0001 Encounter for general adult medical examination with abnormal findings: Secondary | ICD-10-CM | POA: Diagnosis not present

## 2020-05-16 DIAGNOSIS — I700 Atherosclerosis of aorta: Secondary | ICD-10-CM | POA: Diagnosis not present

## 2020-05-16 DIAGNOSIS — E669 Obesity, unspecified: Secondary | ICD-10-CM | POA: Diagnosis not present

## 2020-05-16 DIAGNOSIS — I129 Hypertensive chronic kidney disease with stage 1 through stage 4 chronic kidney disease, or unspecified chronic kidney disease: Secondary | ICD-10-CM | POA: Diagnosis not present

## 2020-05-16 DIAGNOSIS — H6123 Impacted cerumen, bilateral: Secondary | ICD-10-CM | POA: Diagnosis not present

## 2020-05-16 DIAGNOSIS — N1832 Chronic kidney disease, stage 3b: Secondary | ICD-10-CM | POA: Diagnosis not present
